# Patient Record
Sex: Male | Born: 1990 | Race: Black or African American | Hispanic: No | Marital: Single | Smoking: Current every day smoker
Health system: Southern US, Community
[De-identification: ages and names within clinical notes are randomized; demographics above are authoritative.]

## PROBLEM LIST (undated history)

## (undated) DIAGNOSIS — F988 Other specified behavioral and emotional disorders with onset usually occurring in childhood and adolescence: Secondary | ICD-10-CM

## (undated) DIAGNOSIS — K859 Acute pancreatitis without necrosis or infection, unspecified: Secondary | ICD-10-CM

## (undated) DIAGNOSIS — Z789 Other specified health status: Secondary | ICD-10-CM

## (undated) HISTORY — PX: NO PAST SURGERIES: SHX2092

---
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
## 2010-06-03 ENCOUNTER — Emergency Department: Payer: Self-pay | Admitting: Emergency Medicine

## 2012-09-06 ENCOUNTER — Emergency Department: Payer: Self-pay | Admitting: Emergency Medicine

## 2013-08-20 ENCOUNTER — Emergency Department: Payer: Self-pay | Admitting: Emergency Medicine

## 2016-02-24 ENCOUNTER — Emergency Department: Payer: Self-pay

## 2016-02-24 ENCOUNTER — Encounter: Payer: Self-pay | Admitting: Emergency Medicine

## 2016-02-24 ENCOUNTER — Emergency Department
Admission: EM | Admit: 2016-02-24 | Discharge: 2016-02-24 | Disposition: A | Discharge status: Home / Self Care | Payer: Self-pay | Attending: Emergency Medicine | Admitting: Emergency Medicine

## 2016-02-24 DIAGNOSIS — F17210 Nicotine dependence, cigarettes, uncomplicated: Secondary | ICD-10-CM | POA: Insufficient documentation

## 2016-02-24 DIAGNOSIS — A084 Viral intestinal infection, unspecified: Secondary | ICD-10-CM | POA: Insufficient documentation

## 2016-02-24 LAB — URINALYSIS COMPLETE WITH MICROSCOPIC (ARMC ONLY)
BACTERIA UA: NONE SEEN
Bilirubin Urine: NEGATIVE
GLUCOSE, UA: NEGATIVE mg/dL
Hgb urine dipstick: NEGATIVE
LEUKOCYTES UA: NEGATIVE
NITRITE: NEGATIVE
PROTEIN: NEGATIVE mg/dL
SPECIFIC GRAVITY, URINE: 1.011 (ref 1.005–1.030)
Squamous Epithelial / LPF: NONE SEEN
pH: 6 (ref 5.0–8.0)

## 2016-02-24 LAB — COMPREHENSIVE METABOLIC PANEL
ALT: 12 U/L — AB (ref 17–63)
AST: 19 U/L (ref 15–41)
Albumin: 4.1 g/dL (ref 3.5–5.0)
Alkaline Phosphatase: 61 U/L (ref 38–126)
Anion gap: 12 (ref 5–15)
BILIRUBIN TOTAL: 1 mg/dL (ref 0.3–1.2)
BUN: 8 mg/dL (ref 6–20)
CALCIUM: 8.9 mg/dL (ref 8.9–10.3)
CHLORIDE: 96 mmol/L — AB (ref 101–111)
CO2: 23 mmol/L (ref 22–32)
CREATININE: 1.2 mg/dL (ref 0.61–1.24)
Glucose, Bld: 95 mg/dL (ref 65–99)
Potassium: 3.3 mmol/L — ABNORMAL LOW (ref 3.5–5.1)
Sodium: 131 mmol/L — ABNORMAL LOW (ref 135–145)
TOTAL PROTEIN: 7.9 g/dL (ref 6.5–8.1)

## 2016-02-24 LAB — URINE DRUG SCREEN, QUALITATIVE (ARMC ONLY)
AMPHETAMINES, UR SCREEN: NOT DETECTED
Barbiturates, Ur Screen: NOT DETECTED
Benzodiazepine, Ur Scrn: NOT DETECTED
CANNABINOID 50 NG, UR ~~LOC~~: POSITIVE — AB
COCAINE METABOLITE, UR ~~LOC~~: POSITIVE — AB
MDMA (ECSTASY) UR SCREEN: NOT DETECTED
Methadone Scn, Ur: NOT DETECTED
Opiate, Ur Screen: NOT DETECTED
Phencyclidine (PCP) Ur S: NOT DETECTED
TRICYCLIC, UR SCREEN: NOT DETECTED

## 2016-02-24 LAB — CBC
HCT: 43.6 % (ref 40.0–52.0)
Hemoglobin: 15.3 g/dL (ref 13.0–18.0)
MCH: 31.6 pg (ref 26.0–34.0)
MCHC: 35.2 g/dL (ref 32.0–36.0)
MCV: 89.7 fL (ref 80.0–100.0)
Platelets: 190 10*3/uL (ref 150–440)
RBC: 4.86 MIL/uL (ref 4.40–5.90)
RDW: 12.8 % (ref 11.5–14.5)
WBC: 15.9 10*3/uL — AB (ref 3.8–10.6)

## 2016-02-24 LAB — LIPASE, BLOOD: Lipase: 24 U/L (ref 11–51)

## 2016-02-24 LAB — POCT RAPID STREP A: STREPTOCOCCUS, GROUP A SCREEN (DIRECT): NEGATIVE

## 2016-02-24 LAB — MONONUCLEOSIS SCREEN: MONO SCREEN: NEGATIVE

## 2016-02-24 MED ORDER — PROMETHAZINE HCL 12.5 MG PO TABS: 12.5000 mg | ORAL_TABLET | Freq: Four times a day (QID) | ORAL | Status: DC | PRN

## 2016-02-24 MED ORDER — PROCHLORPERAZINE EDISYLATE 5 MG/ML IJ SOLN
10.0000 mg | Freq: Once | INTRAMUSCULAR | Status: AC
  Administered 2016-02-24: 10 mg via INTRAVENOUS
  Filled 2016-02-24: qty 2

## 2016-02-24 MED ORDER — SODIUM CHLORIDE 0.9 % IV BOLUS (SEPSIS)
1000.0000 mL | Freq: Once | INTRAVENOUS | Status: AC
  Administered 2016-02-24: 1000 mL via INTRAVENOUS

## 2016-02-24 NOTE — ED Notes (Signed)
Patient transported to X-ray 

## 2016-02-24 NOTE — Discharge Instructions (Signed)
Viral Gastroenteritis °Viral gastroenteritis is also known as stomach flu. This condition affects the stomach and intestinal tract. It can cause sudden diarrhea and vomiting. The illness typically lasts 3 to 8 days. Most people develop an immune response that eventually gets rid of the virus. While this natural response develops, the virus can make you quite ill. °CAUSES  °Many different viruses can cause gastroenteritis, such as rotavirus or noroviruses. You can catch one of these viruses by consuming contaminated food or water. You may also catch a virus by sharing utensils or other personal items with an infected person or by touching a contaminated surface. °SYMPTOMS  °The most common symptoms are diarrhea and vomiting. These problems can cause a severe loss of body fluids (dehydration) and a body salt (electrolyte) imbalance. Other symptoms may include: °· Fever. °· Headache. °· Fatigue. °· Abdominal pain. °DIAGNOSIS  °Your caregiver can usually diagnose viral gastroenteritis based on your symptoms and a physical exam. A stool sample may also be taken to test for the presence of viruses or other infections. °TREATMENT  °This illness typically goes away on its own. Treatments are aimed at rehydration. The most serious cases of viral gastroenteritis involve vomiting so severely that you are not able to keep fluids down. In these cases, fluids must be given through an intravenous line (IV). °HOME CARE INSTRUCTIONS  °· Drink enough fluids to keep your urine clear or pale yellow. Drink small amounts of fluids frequently and increase the amounts as tolerated. °· Ask your caregiver for specific rehydration instructions. °· Avoid: °¨ Foods high in sugar. °¨ Alcohol. °¨ Carbonated drinks. °¨ Tobacco. °¨ Juice. °¨ Caffeine drinks. °¨ Extremely hot or cold fluids. °¨ Fatty, greasy foods. °¨ Too much intake of anything at one time. °¨ Dairy products until 24 to 48 hours after diarrhea stops. °· You may consume probiotics.  Probiotics are active cultures of beneficial bacteria. They may lessen the amount and number of diarrheal stools in adults. Probiotics can be found in yogurt with active cultures and in supplements. °· Wash your hands well to avoid spreading the virus. °· Only take over-the-counter or prescription medicines for pain, discomfort, or fever as directed by your caregiver. Do not give aspirin to children. Antidiarrheal medicines are not recommended. °· Ask your caregiver if you should continue to take your regular prescribed and over-the-counter medicines. °· Keep all follow-up appointments as directed by your caregiver. °SEEK IMMEDIATE MEDICAL CARE IF:  °· You are unable to keep fluids down. °· You do not urinate at least once every 6 to 8 hours. °· You develop shortness of breath. °· You notice blood in your stool or vomit. This may look like coffee grounds. °· You have abdominal pain that increases or is concentrated in one small area (localized). °· You have persistent vomiting or diarrhea. °· You have a fever. °· The patient is a child younger than 3 months, and he or she has a fever. °· The patient is a child older than 3 months, and he or she has a fever and persistent symptoms. °· The patient is a child older than 3 months, and he or she has a fever and symptoms suddenly get worse. °· The patient is a baby, and he or she has no tears when crying. °MAKE SURE YOU:  °· Understand these instructions. °· Will watch your condition. °· Will get help right away if you are not doing well or get worse. °  °This information is not intended to replace   advice given to you by your health care provider. Make sure you discuss any questions you have with your health care provider. °  °Document Released: 10/06/2005 Document Revised: 12/29/2011 Document Reviewed: 07/23/2011 °Elsevier Interactive Patient Education ©2016 [employer] ° °Please return immediately if condition worsens. Please contact her primary physician or the  physician you were given for referral. If you have any specialist physicians involved in her treatment and plan please also contact them. Thank you for using [facility]. ° °

## 2016-02-24 NOTE — ED Notes (Signed)
Pt via EMS for diarrhea x4days, fever of 102.4, pt was given 650 tylenol po. Pt states his head is foggy and has sore throat as well. Pt A&O

## 2016-02-29 NOTE — ED Provider Notes (Signed)
Time Seen: Approximately 1830 I have reviewed the triage notes  Chief Complaint: Diarrhea   History of Present Illness: Johnny Grant is a 25 y.o. male who presents via EMS for a 4 day history of fever and loose watery stool. Patient denies any recent travel or antibiotic therapy. He states he feels "" foggy "" and has trouble concentrating. He said some feelings of lightheadedness without any syncope. He also describes a sore throat mainly with swallowing. He denies any chest pain, shortness of breath, productive cough or wheezing. Patient denies any focal abdominal pain and describes a diffuse occasional crampy discomfort. He denies any melena or hematochezia. Denies any hematemesis or biliary emesis. He denies any illicit drugs.   History reviewed. No pertinent past medical history.  There are no active problems to display for this patient.   History reviewed. No pertinent past surgical history.  History reviewed. No pertinent past surgical history.  Current Outpatient Rx  Name  Route  Sig  Dispense  Refill  . promethazine (PHENERGAN) 12.5 MG tablet   Oral   Take 1 tablet (12.5 mg total) by mouth every 6 (six) hours as needed for nausea or vomiting.   30 tablet   0     Allergies:  Review of patient's allergies indicates no known allergies.  Family History: No family history on file.  Social History: Social History  Substance Use Topics  . Smoking status: Current Every Day Smoker    Types: Cigarettes  . Smokeless tobacco: None  . Alcohol Use: No     Review of Systems:   10 point review of systems was performed and was otherwise negative:  Constitutional: No fever Eyes: No visual disturbances ENT: No sore throat, ear pain Cardiac: No chest pain Respiratory: No shortness of breath, wheezing, or stridor Abdomen: No abdominal pain, no vomiting, Loose watery stool Endocrine: No weight loss, No night sweats Extremities: No peripheral edema, cyanosis Skin: No  rashes, easy bruising Neurologic: No focal weakness, trouble with speech or swollowing Urologic: No dysuria, Hematuria, or urinary frequency   Physical Exam:  ED Triage Vitals  Enc Vitals Group     BP 02/24/16 1725 103/69 mmHg     Pulse Rate 02/24/16 1725 88     Resp 02/24/16 1725 16     Temp 02/24/16 1725 100.3 F (37.9 C)     Temp Source 02/24/16 1725 Oral     SpO2 02/24/16 1725 93 %     Weight --      Height --      Head Cir --      Peak Flow --      Pain Score 02/24/16 1721 8     Pain Loc --      Pain Edu? --      Excl. in GC? --     General: Awake , Alert , and Oriented times 3; GCS 15 Head: Normal cephalic , atraumatic Eyes: Pupils equal , round, reactive to light Nose/Throat: No nasal drainage, patent upper airway without erythema or exudate.  Neck: Supple, Full range of motion, No anterior adenopathy or palpable thyroid masses Lungs: Clear to ascultation without wheezes , rhonchi, or rales Heart: Regular rate, regular rhythm without murmurs , gallops , or rubs Abdomen: Soft, non tender without rebound, guarding , or rigidity; bowel sounds positive and symmetric in all 4 quadrants. No organomegaly .        Extremities: 2 plus symmetric pulses. No edema, clubbing or cyanosis Neurologic: normal  ambulation, Motor symmetric without deficits, sensory intact Skin: warm, dry, no rashes   Labs:   All laboratory work was reviewed including any pertinent negatives or positives listed below:  Labs Reviewed  COMPREHENSIVE METABOLIC PANEL - Abnormal; Notable for the following:    Sodium 131 (*)    Potassium 3.3 (*)    Chloride 96 (*)    ALT 12 (*)    All other components within normal limits  CBC - Abnormal; Notable for the following:    WBC 15.9 (*)    All other components within normal limits  URINALYSIS COMPLETEWITH MICROSCOPIC ([facility] ONLY) - Abnormal; Notable for the following:    Color, Urine YELLOW (*)    APPearance CLEAR (*)    Ketones, ur 1+ (*)    All other  components within normal limits  URINE DRUG SCREEN, QUALITATIVE ([facility] ONLY) - Abnormal; Notable for the following:    Cocaine Metabolite,Ur [place] POSITIVE (*)    Cannabinoid 50 Ng, Ur [place] POSITIVE (*)    All other components within normal limits  LIPASE, BLOOD  MONONUCLEOSIS SCREEN  POCT RAPID STREP A     Radiology:   CLINICAL DATA: Sore throat, fever and diarrhea or 4 days.  EXAM: CHEST 2 VIEW  COMPARISON: 09/06/2012  FINDINGS: The heart size and mediastinal contours are within normal limits. Both lungs are clear. The visualized skeletal structures are unremarkable.  IMPRESSION: Normal chest x-ray.   Electronically Signed By: Rudie Meyer M.D. On: 02/24/2016 18:43    I personally reviewed the radiologic studies   P ED Course:  With patient's consent and his laboratory work and x-ray findings were reviewed with family at the bedside. Patient was given IV fluids and Phenergan here in emergency department felt symptomatically improved. Stool sample was requested but the patient did not have any episodes of diarrhea while here in emergency department. He arrived with some persistent tachycardia which may be cocaine related. His mental status cleared with observation and I felt this may also be illicit drug usage. She was advised drink plenty of fluids and advance his diet as tolerated and return here if he has any focal abdominal pain, melena, or hematochezia. I felt his diarrhea was unlikely to be significant invasive diarrhea or C. difficile toxin.    Assessment:  Viral gastroenteritis Polysubstance abuse  Final Clinical Impression:   Final diagnoses:  Viral gastroenteritis     Plan: Outpatient management Patient was advised to return immediately if condition worsens. Patient was advised to follow up with their primary care physician or other specialized physicians involved in their outpatient care. The patient and/or family member/power of attorney had  laboratory results reviewed at the bedside. All questions and concerns were addressed and appropriate discharge instructions were distributed by the nursing staff.             Jennye Moccasin, MD 02/29/16 [phone]

## 2016-07-24 ENCOUNTER — Encounter: Payer: Self-pay | Admitting: Medical Oncology

## 2016-07-24 ENCOUNTER — Observation Stay
Admission: EM | Admit: 2016-07-24 | Discharge: 2016-07-28 | Disposition: A | Discharge status: Home / Self Care | Payer: Self-pay | Attending: Internal Medicine | Admitting: Internal Medicine

## 2016-07-24 ENCOUNTER — Emergency Department: Payer: Self-pay

## 2016-07-24 DIAGNOSIS — F17210 Nicotine dependence, cigarettes, uncomplicated: Secondary | ICD-10-CM | POA: Insufficient documentation

## 2016-07-24 DIAGNOSIS — N390 Urinary tract infection, site not specified: Secondary | ICD-10-CM

## 2016-07-24 DIAGNOSIS — R1031 Right lower quadrant pain: Principal | ICD-10-CM | POA: Insufficient documentation

## 2016-07-24 DIAGNOSIS — R197 Diarrhea, unspecified: Secondary | ICD-10-CM | POA: Insufficient documentation

## 2016-07-24 DIAGNOSIS — R109 Unspecified abdominal pain: Secondary | ICD-10-CM

## 2016-07-24 DIAGNOSIS — J029 Acute pharyngitis, unspecified: Secondary | ICD-10-CM | POA: Insufficient documentation

## 2016-07-24 DIAGNOSIS — Z23 Encounter for immunization: Secondary | ICD-10-CM | POA: Insufficient documentation

## 2016-07-24 DIAGNOSIS — F988 Other specified behavioral and emotional disorders with onset usually occurring in childhood and adolescence: Secondary | ICD-10-CM | POA: Insufficient documentation

## 2016-07-24 DIAGNOSIS — R112 Nausea with vomiting, unspecified: Secondary | ICD-10-CM | POA: Diagnosis present

## 2016-07-24 DIAGNOSIS — R51 Headache: Secondary | ICD-10-CM | POA: Insufficient documentation

## 2016-07-24 HISTORY — DX: Other specified behavioral and emotional disorders with onset usually occurring in childhood and adolescence: F98.8

## 2016-07-24 HISTORY — DX: Other specified health status: Z78.9

## 2016-07-24 LAB — COMPREHENSIVE METABOLIC PANEL
ALBUMIN: 3.7 g/dL (ref 3.5–5.0)
ALK PHOS: 57 U/L (ref 38–126)
ALT: 11 U/L — AB (ref 17–63)
ANION GAP: 10 (ref 5–15)
AST: 21 U/L (ref 15–41)
BILIRUBIN TOTAL: 0.5 mg/dL (ref 0.3–1.2)
BUN: 8 mg/dL (ref 6–20)
CALCIUM: 8.9 mg/dL (ref 8.9–10.3)
CO2: 25 mmol/L (ref 22–32)
Chloride: 95 mmol/L — ABNORMAL LOW (ref 101–111)
Creatinine, Ser: 1.26 mg/dL — ABNORMAL HIGH (ref 0.61–1.24)
GFR calc Af Amer: >60 mL/min (ref >60)
GFR calc non Af Amer: >60 mL/min (ref >60)
GLUCOSE: 124 mg/dL — AB (ref 65–99)
Potassium: 3.4 mmol/L — ABNORMAL LOW (ref 3.5–5.1)
SODIUM: 130 mmol/L — AB (ref 135–145)
Total Protein: 8 g/dL (ref 6.5–8.1)

## 2016-07-24 LAB — CBC
HEMATOCRIT: 44.8 % (ref 40.0–52.0)
HEMOGLOBIN: 15.7 g/dL (ref 13.0–18.0)
MCH: 32 pg (ref 26.0–34.0)
MCHC: 35.1 g/dL (ref 32.0–36.0)
MCV: 91.2 fL (ref 80.0–100.0)
Platelets: 163 10*3/uL (ref 150–440)
RBC: 4.91 MIL/uL (ref 4.40–5.90)
RDW: 12.6 % (ref 11.5–14.5)
WBC: 14.5 10*3/uL — ABNORMAL HIGH (ref 3.8–10.6)

## 2016-07-24 LAB — POCT RAPID STREP A: STREPTOCOCCUS, GROUP A SCREEN (DIRECT): NEGATIVE

## 2016-07-24 LAB — LIPASE, BLOOD: Lipase: 25 U/L (ref 11–51)

## 2016-07-24 MED ORDER — IOPAMIDOL (ISOVUE-300) INJECTION 61%
100.0000 mL | Freq: Once | INTRAVENOUS | Status: AC | PRN
  Administered 2016-07-24: 100 mL via INTRAVENOUS
  Filled 2016-07-24: qty 100

## 2016-07-24 MED ORDER — ACETAMINOPHEN 325 MG PO TABS
650.0000 mg | ORAL_TABLET | Freq: Four times a day (QID) | ORAL | Status: DC | PRN
  Administered 2016-07-24: 650 mg via ORAL
  Filled 2016-07-24: qty 2

## 2016-07-24 MED ORDER — ONDANSETRON HCL 4 MG PO TABS: 4.0000 mg | ORAL_TABLET | Freq: Four times a day (QID) | ORAL | Status: DC | PRN

## 2016-07-24 MED ORDER — MORPHINE SULFATE (PF) 4 MG/ML IV SOLN
4.0000 mg | Freq: Once | INTRAVENOUS | Status: AC
  Administered 2016-07-24: 4 mg via INTRAVENOUS
  Filled 2016-07-24: qty 1

## 2016-07-24 MED ORDER — SODIUM CHLORIDE 0.9 % IV BOLUS (SEPSIS)
1000.0000 mL | Freq: Once | INTRAVENOUS | Status: AC
  Administered 2016-07-24: 1000 mL via INTRAVENOUS

## 2016-07-24 MED ORDER — ONDANSETRON HCL 4 MG/2ML IJ SOLN
INTRAMUSCULAR | Status: AC
  Administered 2016-07-24: 4 mg via INTRAVENOUS
  Filled 2016-07-24: qty 2

## 2016-07-24 MED ORDER — ACETAMINOPHEN 650 MG RE SUPP: 650.0000 mg | Freq: Four times a day (QID) | RECTAL | Status: DC | PRN

## 2016-07-24 MED ORDER — ONDANSETRON HCL 4 MG/2ML IJ SOLN
4.0000 mg | Freq: Four times a day (QID) | INTRAMUSCULAR | Status: DC | PRN
Start: 2016-07-24 — End: 2016-07-28

## 2016-07-24 MED ORDER — INFLUENZA VAC SPLIT QUAD 0.5 ML IM SUSY
0.5000 mL | PREFILLED_SYRINGE | INTRAMUSCULAR | Status: AC
  Administered 2016-07-28: 0.5 mL via INTRAMUSCULAR
  Filled 2016-07-24: qty 0.5

## 2016-07-24 MED ORDER — PNEUMOCOCCAL VAC POLYVALENT 25 MCG/0.5ML IJ INJ
0.5000 mL | INJECTION | INTRAMUSCULAR | Status: AC
  Administered 2016-07-28: 0.5 mL via INTRAMUSCULAR
  Filled 2016-07-24: qty 0.5

## 2016-07-24 MED ORDER — ACETAMINOPHEN 325 MG PO TABS
650.0000 mg | ORAL_TABLET | Freq: Four times a day (QID) | ORAL | Status: DC | PRN
  Filled 2016-07-24 (×2): qty 2

## 2016-07-24 MED ORDER — MORPHINE SULFATE (PF) 4 MG/ML IV SOLN
4.0000 mg | Freq: Once | INTRAVENOUS | Status: AC
  Administered 2016-07-24: 4 mg via INTRAVENOUS

## 2016-07-24 MED ORDER — ENOXAPARIN SODIUM 40 MG/0.4ML ~~LOC~~ SOLN
40.0000 mg | SUBCUTANEOUS | Status: DC
  Administered 2016-07-26: 40 mg via SUBCUTANEOUS
  Filled 2016-07-24 (×2): qty 0.4

## 2016-07-24 MED ORDER — SODIUM CHLORIDE 0.9 % IV SOLN
INTRAVENOUS | Status: AC
  Administered 2016-07-25: via INTRAVENOUS

## 2016-07-24 MED ORDER — IOPAMIDOL (ISOVUE-300) INJECTION 61%
30.0000 mL | Freq: Once | INTRAVENOUS | Status: AC | PRN
  Administered 2016-07-24: 30 mL via ORAL
  Filled 2016-07-24: qty 30

## 2016-07-24 MED ORDER — MORPHINE SULFATE (PF) 4 MG/ML IV SOLN
INTRAVENOUS | Status: AC
  Administered 2016-07-24: 4 mg via INTRAVENOUS
  Filled 2016-07-24: qty 1

## 2016-07-24 MED ORDER — ONDANSETRON HCL 4 MG/2ML IJ SOLN
4.0000 mg | Freq: Once | INTRAMUSCULAR | Status: AC
  Administered 2016-07-24: 4 mg via INTRAVENOUS

## 2016-07-24 MED ORDER — OXYCODONE HCL 5 MG PO TABS
5.0000 mg | ORAL_TABLET | ORAL | Status: DC | PRN
  Administered 2016-07-25 – 2016-07-28 (×15): 5 mg via ORAL
  Filled 2016-07-24 (×16): qty 1

## 2016-07-24 MED ORDER — ACETAMINOPHEN 325 MG PO TABS
650.0000 mg | ORAL_TABLET | Freq: Four times a day (QID) | ORAL | Status: DC | PRN
  Administered 2016-07-25 – 2016-07-26 (×2): 650 mg via ORAL

## 2016-07-24 NOTE — ED Notes (Signed)
Surgeon at bedside.  

## 2016-07-24 NOTE — ED Triage Notes (Signed)
Pt reports for 3 days he has been having fever, headache, sore throat, nausea vomiting and diarrhea.

## 2016-07-24 NOTE — Consult Note (Signed)
Date of Consultation:  07/24/2016  Requesting Physician:  Gladstone Pih, M.D.  Reason for Consultation:  Abdominal pain with nausea, vomiting, and diarrhea  History of Present Illness: Johnny Grant is a 25 y.o. male who presents with a two-week history of abdominal pain associated with nausea with multiple episodes of emesis as well as multiple episodes of diarrhea. He also reports feeling intermittently feverish and has more recently been having headaches. He also reports feeling weak in the key almost "passed out." He reports that he's been able tolerate some fluids and by mouth intake does not affect the pain that he has. The patient reports that his diarrhea is dark red or maroon in color and reports seeing blood in the toilet bowl as well. Denies any chest pain or shortness of breath, hematuria or dysuria. He also denies any recent travel, antibiotic therapy, or ingestion of raw or expired food. He presented today to emergency room and had workup consisting of laboratory studies and imaging studies. He was previously seen in May with a 4 day history of fever and loose watery stool and was diagnosed with viral gastroenteritis.  Today, he has a white blood cell count of 14.5 and a CT scan which was concerning for a possible early or partial small bowel obstruction. Surgery has been consulted for further evaluation.  Past Medical History: Past Medical History:  Diagnosis Date  . Patient denies medical problems      Past Surgical History: Past Surgical History:  Procedure Laterality Date  . NO PAST SURGERIES      Home Medications: Prior to Admission medications   Medication Sig Start Date End Date Taking? Authorizing Provider  promethazine (PHENERGAN) 12.5 MG tablet Take 1 tablet (12.5 mg total) by mouth every 6 (six) hours as needed for nausea or vomiting. 02/24/16   Jennye Moccasin, MD    Allergies: No Known Allergies  Social History:  reports that he has been smoking  Cigarettes.  He does not have any smokeless tobacco history on file. He reports that he does not drink alcohol or use drugs.   Family History: No family history on file.  Review of Systems: Review of Systems  Constitutional: Positive for fever and malaise/fatigue.  Eyes: Negative for blurred vision.  Respiratory: Negative for cough and shortness of breath.   Cardiovascular: Negative for chest pain and leg swelling.  Gastrointestinal: Positive for abdominal pain, blood in stool, diarrhea, nausea and vomiting. Negative for constipation and heartburn.  Genitourinary: Negative for dysuria and hematuria.  Musculoskeletal: Negative for myalgias.  Skin: Negative for rash.  Neurological: Positive for dizziness and headaches.  Psychiatric/Behavioral: Negative for depression.    Physical Exam BP 128/72 (BP Location: Right Arm)   Pulse 80   Temp 100.2 F (37.9 C) (Oral)   Resp 18   Ht 5\' 6"  (1.676 m)   Wt 72.6 kg (160 lb)   SpO2 98%   BMI 25.82 kg/m  CONSTITUTIONAL: No acute distress HEENT:  Normocephalic, atraumatic, extraocular motion intact. NECK: Trachea is midline, and there is no jugular venous distension.  LYMPH NODES:  Lymph nodes in the neck are not enlarged. RESPIRATORY:  Lungs are clear, and breath sounds are equal bilaterally. Normal respiratory effort without pathologic use of accessory muscles. CARDIOVASCULAR: Heart is regular without murmurs, gallops, or rubs. GI: The abdomen is soft, nondistended, with tenderness to palpation in the center abdomen and right side. There are no peritoneal signs. There were no palpable masses. There was no hepatosplenomegaly. MUSCULOSKELETAL:  Normal muscle strength and tone in all four extremities.  No peripheral edema or cyanosis. SKIN: Skin turgor is normal. There are no pathologic skin lesions.  NEUROLOGIC:  Motor and sensation is grossly normal.  Cranial nerves are grossly intact. PSYCH:  Alert and oriented to person, place and time.  Affect is normal.  Laboratory Analysis: Results for orders placed or performed during the hospital encounter of 07/24/16 (from the past 24 hour(s))  Lipase, blood     Status: None   Collection Time: 07/24/16  2:09 PM  Result Value Ref Range   Lipase 25 11 - 51 U/L  Comprehensive metabolic panel     Status: Abnormal   Collection Time: 07/24/16  2:09 PM  Result Value Ref Range   Sodium 130 (L) 135 - 145 mmol/L   Potassium 3.4 (L) 3.5 - 5.1 mmol/L   Chloride 95 (L) 101 - 111 mmol/L   CO2 25 22 - 32 mmol/L   Glucose, Bld 124 (H) 65 - 99 mg/dL   BUN 8 6 - 20 mg/dL   Creatinine, Ser 8.11 (H) 0.61 - 1.24 mg/dL   Calcium 8.9 8.9 - 91.4 mg/dL   Total Protein 8.0 6.5 - 8.1 g/dL   Albumin 3.7 3.5 - 5.0 g/dL   AST 21 15 - 41 U/L   ALT 11 (L) 17 - 63 U/L   Alkaline Phosphatase 57 38 - 126 U/L   Total Bilirubin 0.5 0.3 - 1.2 mg/dL   GFR calc non Af Amer >60 >60 mL/min   GFR calc Af Amer >60 >60 mL/min   Anion gap 10 5 - 15  CBC     Status: Abnormal   Collection Time: 07/24/16  2:09 PM  Result Value Ref Range   WBC 14.5 (H) 3.8 - 10.6 K/uL   RBC 4.91 4.40 - 5.90 MIL/uL   Hemoglobin 15.7 13.0 - 18.0 g/dL   HCT 78.2 95.6 - 21.3 %   MCV 91.2 80.0 - 100.0 fL   MCH 32.0 26.0 - 34.0 pg   MCHC 35.1 32.0 - 36.0 g/dL   RDW 08.6 57.8 - 46.9 %   Platelets 163 150 - 440 K/uL  POCT rapid strep A [facility])     Status: None   Collection Time: 07/24/16  2:23 PM  Result Value Ref Range   Streptococcus, Group A Screen (Direct) NEGATIVE NEGATIVE    Imaging: Ct Abdomen Pelvis W Contrast  Result Date: 07/24/2016 CLINICAL DATA:  Right upper quadrant and right lower quadrant pain for 3 days. Vomiting, diarrhea. Blood in stool. EXAM: CT ABDOMEN AND PELVIS WITH CONTRAST TECHNIQUE: Multidetector CT imaging of the abdomen and pelvis was performed using the standard protocol following bolus administration of intravenous contrast. CONTRAST:  ISOVUE-300 IOPAMIDOL (ISOVUE-300) INJECTION 61%  COMPARISON:  None. FINDINGS: Lower chest: No pulmonary nodules, pleural effusions, or infiltrates. Heart size is normal. No imaged pericardial effusion or significant coronary artery calcifications. Hepatobiliary: Gallbladder has a normal appearance. Pancreas: Unremarkable. No pancreatic ductal dilatation or surrounding inflammatory changes. Spleen: Normal in size without focal abnormality. Adrenals/Urinary Tract: Adrenal glands are normal in appearance. Kidneys are symmetric in enhancement and size. No hydronephrosis or focal renal mass. Stomach/Bowel: The stomach is normal in appearance. There is mild dilatation of small bowel loops. Contrast does not reach the distal small bowel loops at the time of the exam. Distal small bowel loops are normal in caliber. Terminal ileum has a normal appearance. Colonic loops are normal in appearance are unopacified. Appendix  is not seen. Vascular/Lymphatic: No evidence for aortic aneurysm. No retroperitoneal or mesenteric adenopathy. Reproductive: Urinary bladder, prostate, and seminal vesicles are normal in appearance. No free pelvic fluid. Other: None Musculoskeletal: Visualized osseous structures have a normal appearance. IMPRESSION: 1. Mild dilatation of proximal small bowel loops. Distal small bowel loops are relatively normal in caliber. Findings raise a question of early or partial small bowel obstruction. A definite transition point is not identified. 2. Appendix is not well seen. 3. No abscess or free air. Electronically Signed   By: Norva PavlovElizabeth  Brown M.D.   On: 07/24/2016 17:34    Assessment and Plan: This is a 25 y.o. male who presents with abdominal pain, nausea, vomiting, and diarrhea that have been ongoing for about 2 weeks. Although the CT scan mentions some mildly dilated loops of small bowel I don't believe there is any small bowel obstruction in this case. This may be more of a gastroenteritis versus inflammatory bowel picture. Given his significant  discomfort and dehydration would recommend admitting to the medicine team for IV fluid resuscitation and pain control as well as workup for other potential sources of his symptoms. Would consider obtaining a gastroenterology consult as well. There are no acute surgical needs at this point. The patient understands this plan and all his questions have been answered.   Howie IllJose Luis Shyheem Whitham, MD [facility]

## 2016-07-24 NOTE — H&P (Signed)
[facility] [place] [facility]   PATIENT NAME: Johnny Grant    MR#:  [id number]  DATE OF BIRTH:  01-21-1991  DATE OF ADMISSION:  07/24/2016  PRIMARY CARE PHYSICIAN: No PCP Per Patient   REQUESTING/REFERRING PHYSICIAN: Pershing Proud, MD  CHIEF COMPLAINT:   Chief Complaint  Patient presents with  . Headache  . Nausea  . Emesis  . Diarrhea  . Sore Throat    HISTORY OF PRESENT ILLNESS:  Johnny Grant  is a 25 y.o. male who presents with Abdominal pain, nausea and vomiting with diarrhea. Patient states that he's been having 6-7 stools of diarrhea per day for the last week. Here tonight he was found on CT scan to have a questionable partial small bowel obstruction, though surgical consult did not feel like that was an absolute diagnosis at all. There was some suspicion on the part of the surgeon for possibility of Crohn's. Recommendation was for admission to hospitalist service with symptomatically treatment and consult GI.  PAST MEDICAL HISTORY:   Past Medical History:  Diagnosis Date  . Patient denies medical problems     PAST SURGICAL HISTORY:   Past Surgical History:  Procedure Laterality Date  . NO PAST SURGERIES      SOCIAL HISTORY:   Social History  Substance Use Topics  . Smoking status: Current Every Day Smoker    Types: Cigarettes  . Smokeless tobacco: Not on file  . Alcohol use No    FAMILY HISTORY:   Family History  Problem Relation Age of Onset  . Osteoporosis Maternal Grandmother     DRUG ALLERGIES:  No Known Allergies  MEDICATIONS AT HOME:   Prior to Admission medications   Medication Sig Start Date End Date Taking? Authorizing Provider  promethazine (PHENERGAN) 12.5 MG tablet Take 1 tablet (12.5 mg total) by mouth every 6 (six) hours as needed for nausea or vomiting. 02/24/16   Jennye Moccasin, MD    REVIEW OF SYSTEMS:  Review of Systems  Constitutional: Negative for chills, fever, malaise/fatigue and weight loss.   HENT: Negative for ear pain, hearing loss and tinnitus.   Eyes: Negative for blurred vision, double vision, pain and redness.  Respiratory: Negative for cough, hemoptysis and shortness of breath.   Cardiovascular: Negative for chest pain, palpitations, orthopnea and leg swelling.  Gastrointestinal: Positive for abdominal pain, diarrhea, nausea and vomiting. Negative for constipation.  Genitourinary: Negative for dysuria, frequency and hematuria.  Musculoskeletal: Negative for back pain, joint pain and neck pain.  Skin:       No acne, rash, or lesions  Neurological: Negative for dizziness, tremors, focal weakness and weakness.  Endo/Heme/Allergies: Negative for polydipsia. Does not bruise/bleed easily.  Psychiatric/Behavioral: Negative for depression. The patient is not nervous/anxious and does not have insomnia.      VITAL SIGNS:   Vitals:   07/24/16 1407 07/24/16 1408 07/24/16 1837  BP: 131/76  128/72  Pulse: 91  80  Resp: 18  18  Temp: 100.2 F (37.9 C)    TempSrc: Oral    SpO2: 97%  98%  Weight:  72.6 kg (160 lb)   Height:  5\' 6"  (1.676 m)    Wt Readings from Last 3 Encounters:  07/24/16 72.6 kg (160 lb)    PHYSICAL EXAMINATION:  Physical Exam  Vitals reviewed. Constitutional: He is oriented to person, place, and time. He appears well-developed and well-nourished. No distress.  HENT:  Head: Normocephalic and atraumatic.  Mouth/Throat: Oropharynx is clear and moist.  Eyes: Conjunctivae and EOM are normal. Pupils are equal, round, and reactive to light. No scleral icterus.  Neck: Normal range of motion. Neck supple. No JVD present. No thyromegaly present.  Cardiovascular: Normal rate, regular rhythm and intact distal pulses.  Exam reveals no gallop and no friction rub.   No murmur heard. Respiratory: Effort normal and breath sounds normal. No respiratory distress. He has no wheezes. He has no rales.  GI: Soft. Bowel sounds are normal. He exhibits no distension. There is  tenderness (diffuse, mild).  Musculoskeletal: Normal range of motion. He exhibits no edema.  No arthritis, no gout  Lymphadenopathy:    He has no cervical adenopathy.  Neurological: He is alert and oriented to person, place, and time. No cranial nerve deficit.  No dysarthria, no aphasia  Skin: Skin is warm and dry. No rash noted. No erythema.  Psychiatric: He has a normal mood and affect. His behavior is normal. Judgment and thought content normal.    LABORATORY PANEL:   CBC  Recent Labs Lab 07/24/16 1409  WBC 14.5*  HGB 15.7  HCT 44.8  PLT 163   ------------------------------------------------------------------------------------------------------------------  Chemistries   Recent Labs Lab 07/24/16 1409  NA 130*  K 3.4*  CL 95*  CO2 25  GLUCOSE 124*  BUN 8  CREATININE 1.26*  CALCIUM 8.9  AST 21  ALT 11*  ALKPHOS 57  BILITOT 0.5   ------------------------------------------------------------------------------------------------------------------  Cardiac Enzymes No results for input(s): TROPONINI in the last 168 hours. ------------------------------------------------------------------------------------------------------------------  RADIOLOGY:  Ct Abdomen Pelvis W Contrast  Result Date: 07/24/2016 CLINICAL DATA:  Right upper quadrant and right lower quadrant pain for 3 days. Vomiting, diarrhea. Blood in stool. EXAM: CT ABDOMEN AND PELVIS WITH CONTRAST TECHNIQUE: Multidetector CT imaging of the abdomen and pelvis was performed using the standard protocol following bolus administration of intravenous contrast. CONTRAST:  100mL ISOVUE-300 IOPAMIDOL (ISOVUE-300) INJECTION 61% COMPARISON:  None. FINDINGS: Lower chest: No pulmonary nodules, pleural effusions, or infiltrates. Heart size is normal. No imaged pericardial effusion or significant coronary artery calcifications. Hepatobiliary: Gallbladder has a normal appearance. Pancreas: Unremarkable. No pancreatic ductal  dilatation or surrounding inflammatory changes. Spleen: Normal in size without focal abnormality. Adrenals/Urinary Tract: Adrenal glands are normal in appearance. Kidneys are symmetric in enhancement and size. No hydronephrosis or focal renal mass. Stomach/Bowel: The stomach is normal in appearance. There is mild dilatation of small bowel loops. Contrast does not reach the distal small bowel loops at the time of the exam. Distal small bowel loops are normal in caliber. Terminal ileum has a normal appearance. Colonic loops are normal in appearance are unopacified. Appendix is not seen. Vascular/Lymphatic: No evidence for aortic aneurysm. No retroperitoneal or mesenteric adenopathy. Reproductive: Urinary bladder, prostate, and seminal vesicles are normal in appearance. No free pelvic fluid. Other: None Musculoskeletal: Visualized osseous structures have a normal appearance. IMPRESSION: 1. Mild dilatation of proximal small bowel loops. Distal small bowel loops are relatively normal in caliber. Findings raise a question of early or partial small bowel obstruction. A definite transition point is not identified. 2. Appendix is not well seen. 3. No abscess or free air. Electronically Signed   By: Norva PavlovElizabeth  Brown M.D.   On: 07/24/2016 17:34    EKG:   Orders placed or performed in visit on 07/09/09  . EKG 12-Lead    IMPRESSION AND PLAN:  Principal Problem:   Intractable abdominal pain - unclear etiology at this time. CT scan very mildly suggestive of possible partial small bowel obstruction. However, clinical  symptoms are not is consistent with this diagnosis. Surgical consult felt like Crohn's was a possibility in the differential. GI consult ordered, when necessary pain control Active Problems:   Nausea vomiting and diarrhea - IV fluids tonight, when necessary antiemetics  All the records are reviewed and case discussed with ED provider. Management plans discussed with the patient and/or family.  DVT  PROPHYLAXIS: SubQ lovenox  GI PROPHYLAXIS: None  ADMISSION STATUS: Observation  CODE STATUS: Full Code Status History    This patient does not have a recorded code status. Please follow your organizational policy for patients in this situation.      TOTAL TIME TAKING CARE OF THIS PATIENT: 40 minutes.    Alenah Sarria FIELDING 07/24/2016, 9:34 PM  [facility]  Office  [phone]  CC: Primary care physician; No PCP Per Patient

## 2016-07-24 NOTE — ED Notes (Signed)
Finished PO contrast   States no change in pain

## 2016-07-24 NOTE — ED Notes (Addendum)
See triage note  Has had fever  Sore throat with some n/v/d and generalized abd disocmfort which started 3 days ago  Low grade fever on arrival  Last time vomited was last pm  Has had diarrhea several times today

## 2016-07-24 NOTE — ED Provider Notes (Signed)
[facility] Emergency Department Provider Note   ____________________________________________   First MD Initiated Contact with Patient 07/24/16 1508     (approximate)  I have reviewed the triage vital signs and the nursing notes.   HISTORY  Chief Complaint Headache; Nausea; Emesis; Diarrhea; and Sore Throat   HPI Johnny Grant is a 25 y.o. male without any chronic medical problems who is presenting to the emergency department today with diffuse abdominal pain as well as nausea vomiting and diarrhea. He says that he has had 6-7 ounces of diarrhea per day. Denies any blood in the stool. Also said he has had nausea and vomiting. Denies any cough or runny nose. Is complaining of a mild sore throat as well. Also says has a headache which is mild and bitemporal as well as some lightheadedness which is intermittent. Denies any known sick contacts. Has been taking Tylenol without any relief of his symptoms.     History reviewed. No pertinent past medical history.  There are no active problems to display for this patient.   History reviewed. No pertinent surgical history.  Prior to Admission medications   Medication Sig Start Date End Date Taking? Authorizing Provider  promethazine (PHENERGAN) 12.5 MG tablet Take 1 tablet (12.5 mg total) by mouth every 6 (six) hours as needed for nausea or vomiting. 02/24/16   Jennye Moccasin, MD    Allergies Review of patient's allergies indicates no known allergies.  No family history on file.  Social History Social History  Substance Use Topics  . Smoking status: Current Every Day Smoker    Types: Cigarettes  . Smokeless tobacco: Not on file  . Alcohol use No    Review of Systems Constitutional: as above Eyes: No visual changes. ENT: No sore throat. Cardiovascular: Denies chest pain. Respiratory: Denies shortness of breath. Gastrointestinal:  No constipation. Genitourinary: Negative for  dysuria. Musculoskeletal: Negative for back pain. Skin: Negative for rash. Neurological: Negative for headaches, focal weakness or numbness.  10-point ROS otherwise negative.  ____________________________________________   PHYSICAL EXAM:  VITAL SIGNS: ED Triage Vitals  Enc Vitals Group     BP 07/24/16 1407 131/76     Pulse Rate 07/24/16 1407 91     Resp 07/24/16 1407 18     Temp 07/24/16 1407 100.2 F (37.9 C)     Temp Source 07/24/16 1407 Oral     SpO2 07/24/16 1407 97 %     Weight 07/24/16 1408 160 lb (72.6 kg)     Height 07/24/16 1408 5\' 6"  (1.676 m)     Head Circumference --      Peak Flow --      Pain Score 07/24/16 1408 9     Pain Loc --      Pain Edu? --      Excl. in GC? --     Constitutional: Alert and oriented. Well appearing and in no acute distress. Eyes: Conjunctivae are normal. PERRL. EOMI. Head: Atraumatic. Nose: No congestion/rhinnorhea. Mouth/Throat: Mucous membranes are moist.  Pharyngeal erythema with left sided tonsillar pus. There is also tender lymphadenopathy to the bilateral anterior cervical chains. Neck: No stridor.   Cardiovascular: Normal rate, regular rhythm. Grossly normal heart sounds.  Good peripheral circulation. Respiratory: Normal respiratory effort.  No retractions. Lungs CTAB. Gastrointestinal: Soft With right upper quadrant as well as right flank tenderness to palpation with a negative Murphy sign. No distention. No CVA tenderness. Musculoskeletal: No lower extremity tenderness nor edema.  No joint effusions.  Neurologic:  Normal speech and language. No gross focal neurologic deficits are appreciated. No gait instability. Skin:  Skin is warm, dry and intact. No rash noted. Psychiatric: Mood and affect are normal. Speech and behavior are normal.  ____________________________________________   LABS (all labs ordered are listed, but only abnormal results are displayed)  Labs Reviewed  COMPREHENSIVE METABOLIC PANEL - Abnormal;  Notable for the following:       Result Value   Sodium 130 (*)    Potassium 3.4 (*)    Chloride 95 (*)    Glucose, Bld 124 (*)    Creatinine, Ser 1.26 (*)    ALT 11 (*)    All other components within normal limits  CBC - Abnormal; Notable for the following:    WBC 14.5 (*)    All other components within normal limits  LIPASE, BLOOD  URINALYSIS COMPLETEWITH MICROSCOPIC ([facility] ONLY)  POCT RAPID STREP A   ____________________________________________  EKG   ____________________________________________  RADIOLOGY  CT Abdomen Pelvis W Contrast (Accession [id number]) (Order [id number])  Imaging  Date: 07/24/2016 Department: [facility] EMERGENCY DEPARTMENT Released By/Authorizing: Myrna Blazer, MD (auto-released)  Exam Information   Status Exam Begun  Exam Ended   Final [99] 07/24/2016 5:07 PM 07/24/2016 5:15 PM  PACS Images   Show images for CT Abdomen Pelvis W Contrast  Study Result   CLINICAL DATA:  Right upper quadrant and right lower quadrant pain for 3 days. Vomiting, diarrhea. Blood in stool.  EXAM: CT ABDOMEN AND PELVIS WITH CONTRAST  TECHNIQUE: Multidetector CT imaging of the abdomen and pelvis was performed using the standard protocol following bolus administration of intravenous contrast.  CONTRAST:  ISOVUE-300 IOPAMIDOL (ISOVUE-300) INJECTION 61%  COMPARISON:  None.  FINDINGS: Lower chest: No pulmonary nodules, pleural effusions, or infiltrates. Heart size is normal. No imaged pericardial effusion or significant coronary artery calcifications.  Hepatobiliary: Gallbladder has a normal appearance.  Pancreas: Unremarkable. No pancreatic ductal dilatation or surrounding inflammatory changes.  Spleen: Normal in size without focal abnormality.  Adrenals/Urinary Tract: Adrenal glands are normal in appearance. Kidneys are symmetric in enhancement and size. No hydronephrosis or focal renal  mass.  Stomach/Bowel: The stomach is normal in appearance. There is mild dilatation of small bowel loops. Contrast does not reach the distal small bowel loops at the time of the exam. Distal small bowel loops are normal in caliber. Terminal ileum has a normal appearance. Colonic loops are normal in appearance are unopacified. Appendix is not seen.  Vascular/Lymphatic: No evidence for aortic aneurysm. No retroperitoneal or mesenteric adenopathy.  Reproductive: Urinary bladder, prostate, and seminal vesicles are normal in appearance. No free pelvic fluid.  Other: None  Musculoskeletal: Visualized osseous structures have a normal appearance.  IMPRESSION: 1. Mild dilatation of proximal small bowel loops. Distal small bowel loops are relatively normal in caliber. Findings raise a question of early or partial small bowel obstruction. A definite transition point is not identified. 2. Appendix is not well seen. 3. No abscess or free air.   Electronically Signed   By: Norva Pavlov M.D.   On: 07/24/2016 17:34     ____________________________________________   PROCEDURES  Procedure(s) performed:   Procedures  Critical Care performed:   ____________________________________________   INITIAL IMPRESSION / ASSESSMENT AND PLAN / ED COURSE  Pertinent labs & imaging results that were available during my care of the patient were reviewed by me and considered in my medical decision making (see chart for details).  ----------------------------------------- 550 PM  on 07/24/2016 ----------------------------------------- Discussed the imaging results with Dr. Ludwig LeanLaughlin who reviewed the images. She says that the images most likely a gastroenteritis. However, when I reassessed the patient he is still having moderate to severe pain. He is requesting another dose of pain medications and he is still tender to palpation especially to the right side of the abdomen. He will be  seen by surgery as a consult.   Clinical Course   ----------------------------------------- 9:03 PM on 07/24/2016 -----------------------------------------  Patient evaluated by Dr. Aleen CampiPiscoya of the surgical service who recommends admission to the hospital to the medicine service with a GI consult. Dr. Aleen CampiPiscoya, by Dr. Ludwig LeanLaughlin also is skeptical of the patient having a small bowel obstruction. Patient reporting blood streaks in his stool. Also try multiple doses of IV pain medication and still tender to the right side of the abdomen. Patient aware of the plan for admission to the hospital. Signed out to Dr. Anne HahnWillis.  ____________________________________________   FINAL CLINICAL IMPRESSION(S) / ED DIAGNOSES  Intractable abdominal pain. Nausea vomiting diarrhea.    NEW MEDICATIONS STARTED DURING THIS VISIT:  New Prescriptions   No medications on file     Note:  This document was prepared using Dragon voice recognition software and may include unintentional dictation errors.    Myrna Blazeravid Matthew Mihail Prettyman, MD 07/24/16 2104

## 2016-07-25 ENCOUNTER — Encounter: Payer: Self-pay | Admitting: *Deleted

## 2016-07-25 ENCOUNTER — Observation Stay: Payer: Self-pay

## 2016-07-25 DIAGNOSIS — R109 Unspecified abdominal pain: Secondary | ICD-10-CM

## 2016-07-25 LAB — CBC WITH DIFFERENTIAL/PLATELET
Band Neutrophils: 2 %
Basophils Absolute: 0 10*3/uL (ref 0–0.1)
Basophils Relative: 0 %
Blasts: 0 %
Eosinophils Absolute: 0 10*3/uL (ref 0–0.7)
Eosinophils Relative: 0 %
HCT: 42.2 % (ref 40.0–52.0)
Hemoglobin: 14.6 g/dL (ref 13.0–18.0)
Lymphocytes Relative: 28 %
Lymphs Abs: 3.2 10*3/uL (ref 1.0–3.6)
MCH: 32.1 pg (ref 26.0–34.0)
MCHC: 34.6 g/dL (ref 32.0–36.0)
MCV: 92.6 fL (ref 80.0–100.0)
Metamyelocytes Relative: 0 %
Monocytes Absolute: 2.1 10*3/uL — ABNORMAL HIGH (ref 0.2–1.0)
Monocytes Relative: 18 %
Myelocytes: 0 %
Neutro Abs: 6.1 10*3/uL (ref 1.4–6.5)
Neutrophils Relative %: 52 %
Other: 0 %
Platelets: 155 10*3/uL (ref 150–440)
Promyelocytes Absolute: 0 %
RBC: 4.55 MIL/uL (ref 4.40–5.90)
RDW: 12.5 % (ref 11.5–14.5)
WBC: 11.4 10*3/uL — ABNORMAL HIGH (ref 3.8–10.6)
nRBC: 0 /100 WBC

## 2016-07-25 LAB — C DIFFICILE QUICK SCREEN W PCR REFLEX
C DIFFICLE (CDIFF) ANTIGEN: NEGATIVE
C Diff interpretation: NOT DETECTED
C Diff toxin: NEGATIVE

## 2016-07-25 LAB — URINE DRUG SCREEN, QUALITATIVE (ARMC ONLY)
Amphetamines, Ur Screen: NOT DETECTED
Barbiturates, Ur Screen: NOT DETECTED
Benzodiazepine, Ur Scrn: NOT DETECTED
Cannabinoid 50 Ng, Ur ~~LOC~~: POSITIVE — AB
Cocaine Metabolite,Ur ~~LOC~~: POSITIVE — AB
MDMA (Ecstasy)Ur Screen: NOT DETECTED
Methadone Scn, Ur: NOT DETECTED
Opiate, Ur Screen: POSITIVE — AB
Phencyclidine (PCP) Ur S: NOT DETECTED
Tricyclic, Ur Screen: NOT DETECTED

## 2016-07-25 LAB — SEDIMENTATION RATE: Sed Rate: 49 mm/hr — ABNORMAL HIGH (ref 0–15)

## 2016-07-25 LAB — URINALYSIS COMPLETE WITH MICROSCOPIC (ARMC ONLY)
BILIRUBIN URINE: NEGATIVE
Bacteria, UA: NONE SEEN
Glucose, UA: NEGATIVE mg/dL
Hgb urine dipstick: NEGATIVE
LEUKOCYTES UA: NEGATIVE
NITRITE: NEGATIVE
PH: 6 (ref 5.0–8.0)
Protein, ur: NEGATIVE mg/dL
SPECIFIC GRAVITY, URINE: 1.016 (ref 1.005–1.030)

## 2016-07-25 LAB — GASTROINTESTINAL PANEL BY PCR, STOOL (REPLACES STOOL CULTURE)
ASTROVIRUS: NOT DETECTED
Adenovirus F40/41: NOT DETECTED
CYCLOSPORA CAYETANENSIS: NOT DETECTED
Campylobacter species: NOT DETECTED
Cryptosporidium: NOT DETECTED
E. coli O157: NOT DETECTED
ENTAMOEBA HISTOLYTICA: NOT DETECTED
ENTEROAGGREGATIVE E COLI (EAEC): NOT DETECTED
ENTEROTOXIGENIC E COLI (ETEC): NOT DETECTED
Enteropathogenic E coli (EPEC): NOT DETECTED
GIARDIA LAMBLIA: NOT DETECTED
NOROVIRUS GI/GII: NOT DETECTED
Plesimonas shigelloides: NOT DETECTED
Rotavirus A: NOT DETECTED
Salmonella species: NOT DETECTED
Sapovirus (I, II, IV, and V): NOT DETECTED
Shiga like toxin producing E coli (STEC): NOT DETECTED
Shigella/Enteroinvasive E coli (EIEC): NOT DETECTED
VIBRIO CHOLERAE: NOT DETECTED
VIBRIO SPECIES: NOT DETECTED
Yersinia enterocolitica: NOT DETECTED

## 2016-07-25 LAB — CBC
HEMATOCRIT: 40.5 % (ref 40.0–52.0)
Hemoglobin: 14.1 g/dL (ref 13.0–18.0)
MCH: 31.9 pg (ref 26.0–34.0)
MCHC: 34.7 g/dL (ref 32.0–36.0)
MCV: 91.9 fL (ref 80.0–100.0)
PLATELETS: 146 10*3/uL — AB (ref 150–440)
RBC: 4.4 MIL/uL (ref 4.40–5.90)
RDW: 12.6 % (ref 11.5–14.5)
WBC: 13.4 10*3/uL — ABNORMAL HIGH (ref 3.8–10.6)

## 2016-07-25 LAB — BASIC METABOLIC PANEL
Anion gap: 12 (ref 5–15)
BUN: 8 mg/dL (ref 6–20)
CHLORIDE: 98 mmol/L — AB (ref 101–111)
CO2: 22 mmol/L (ref 22–32)
CREATININE: 1.05 mg/dL (ref 0.61–1.24)
Calcium: 8.2 mg/dL — ABNORMAL LOW (ref 8.9–10.3)
GFR calc Af Amer: >60 mL/min (ref >60)
GFR calc non Af Amer: >60 mL/min (ref >60)
GLUCOSE: 92 mg/dL (ref 65–99)
Potassium: 3.2 mmol/L — ABNORMAL LOW (ref 3.5–5.1)
SODIUM: 132 mmol/L — AB (ref 135–145)

## 2016-07-25 LAB — C-REACTIVE PROTEIN: CRP: 16.4 mg/dL — ABNORMAL HIGH (ref <1.0)

## 2016-07-25 MED ORDER — DIPHENHYDRAMINE HCL 25 MG PO CAPS: 25.0000 mg | ORAL_CAPSULE | Freq: Every evening | ORAL | Status: DC | PRN

## 2016-07-25 MED ORDER — POTASSIUM CHLORIDE 20 MEQ PO PACK
40.0000 meq | PACK | Freq: Two times a day (BID) | ORAL | Status: DC
  Administered 2016-07-25: 40 meq via ORAL
  Filled 2016-07-25 (×2): qty 2

## 2016-07-25 MED ORDER — FAMOTIDINE IN NACL 20-0.9 MG/50ML-% IV SOLN
20.0000 mg | Freq: Two times a day (BID) | INTRAVENOUS | Status: DC
  Administered 2016-07-25 – 2016-07-27 (×6): 20 mg via INTRAVENOUS
  Filled 2016-07-25 (×9): qty 50

## 2016-07-25 MED ORDER — DEXTROSE 5 % IV SOLN
1.0000 g | INTRAVENOUS | Status: DC
  Administered 2016-07-25 – 2016-07-27 (×3): 1 g via INTRAVENOUS
  Filled 2016-07-25 (×4): qty 10

## 2016-07-25 MED ORDER — POTASSIUM CHLORIDE CRYS ER 20 MEQ PO TBCR
40.0000 meq | EXTENDED_RELEASE_TABLET | Freq: Two times a day (BID) | ORAL | Status: DC
  Administered 2016-07-25 – 2016-07-28 (×6): 40 meq via ORAL
  Filled 2016-07-25 (×6): qty 2

## 2016-07-25 MED ORDER — MORPHINE SULFATE (PF) 2 MG/ML IV SOLN
2.0000 mg | Freq: Once | INTRAVENOUS | Status: AC
  Administered 2016-07-25: 2 mg via INTRAVENOUS
  Filled 2016-07-25: qty 1

## 2016-07-25 MED ORDER — SODIUM CHLORIDE 0.9 % IV SOLN
INTRAVENOUS | Status: DC
  Administered 2016-07-25 – 2016-07-28 (×6): via INTRAVENOUS
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
## 2016-07-25 NOTE — Consult Note (Signed)
Patient with acute onset of vomiting, diarrhea, headache, doing better now and no diarrhea while not eating.  He requests more food, will start clear liquids and advance as tolerated.   WBC has fallen from 14 to 11.  PE shows some tenderness in RUQ, no palpable masses.  Will follow with you, see note by Fransico SettersKim Mills.  Patient urine positive for cocaine and cannabis.

## 2016-07-25 NOTE — Progress Notes (Signed)
[facility] [place] [facility]   PATIENT NAME: Johnny Grant    MR#:  [id number]  DATE OF BIRTH:  26-Feb-1991  SUBJECTIVE:  CHIEF COMPLAINT:   Chief Complaint  Patient presents with  . Headache  . Nausea  . Emesis  . Diarrhea  . Sore Throat     Came with nausea, vomit, Abdominal pain , diarrhea for last 1 week with fever.   CT abd without acute findings.   UA now reported positive. Stool studies still need to be checked.  REVIEW OF SYSTEMS:  CONSTITUTIONAL: positive for  fever, fatigue or weakness.  EYES: No blurred or double vision.  EARS, NOSE, AND THROAT: No tinnitus or ear pain.  RESPIRATORY: No cough, shortness of breath, wheezing or hemoptysis.  CARDIOVASCULAR: No chest pain, orthopnea, edema.  GASTROINTESTINAL: positive for nausea, vomiting, diarrhea or abdominal pain.  GENITOURINARY: No dysuria, hematuria.  ENDOCRINE: No polyuria, nocturia,  HEMATOLOGY: No anemia, easy bruising or bleeding SKIN: No rash or lesion. MUSCULOSKELETAL: No joint pain or arthritis.   NEUROLOGIC: No tingling, numbness, weakness.  PSYCHIATRY: No anxiety or depression.   ROS  DRUG ALLERGIES:  No Known Allergies  VITALS:  Blood pressure 130/67, pulse 77, temperature 99.1 F (37.3 C), temperature source Oral, resp. rate 19, height 5\' 6"  (1.676 m), weight 67 kg (147 lb 12.8 oz), SpO2 98 %.  PHYSICAL EXAMINATION:  GENERAL:  25 y.o.-year-old patient lying in the bed with no acute distress.  EYES: Pupils equal, round, reactive to light and accommodation. No scleral icterus. Extraocular muscles intact.  HEENT: Head atraumatic, normocephalic. Oropharynx and nasopharynx clear.  NECK:  Supple, no jugular venous distention. No thyroid enlargement, no tenderness.  LUNGS: Normal breath sounds bilaterally, no wheezing, rales,rhonchi or crepitation. No use of accessory muscles of respiration.  CARDIOVASCULAR: S1, S2 normal. No murmurs, rubs, or gallops.  ABDOMEN: Soft, tender,  nondistended. Bowel sounds present. No organomegaly or mass.  EXTREMITIES: No pedal edema, cyanosis, or clubbing.  NEUROLOGIC: Cranial nerves II through XII are intact. Muscle strength 5/5 in all extremities. Sensation intact. Gait not checked.  PSYCHIATRIC: The patient is alert and oriented x 3.  SKIN: No obvious rash, lesion, or ulcer. Multiple tattoo marks.  Physical Exam LABORATORY PANEL:   CBC  Recent Labs Lab 07/25/16 0532  WBC 13.4*  HGB 14.1  HCT 40.5  PLT 146*   ------------------------------------------------------------------------------------------------------------------  Chemistries   Recent Labs Lab 07/24/16 1409 07/25/16 0532  NA 130* 132*  K 3.4* 3.2*  CL 95* 98*  CO2 25 22  GLUCOSE 124* 92  BUN 8 8  CREATININE 1.26* 1.05  CALCIUM 8.9 8.2*  AST 21  --   ALT 11*  --   ALKPHOS 57  --   BILITOT 0.5  --    ------------------------------------------------------------------------------------------------------------------  Cardiac Enzymes No results for input(s): TROPONINI in the last 168 hours. ------------------------------------------------------------------------------------------------------------------  RADIOLOGY:  Ct Abdomen Pelvis W Contrast  Result Date: 07/24/2016 CLINICAL DATA:  Right upper quadrant and right lower quadrant pain for 3 days. Vomiting, diarrhea. Blood in stool. EXAM: CT ABDOMEN AND PELVIS WITH CONTRAST TECHNIQUE: Multidetector CT imaging of the abdomen and pelvis was performed using the standard protocol following bolus administration of intravenous contrast. CONTRAST:  ISOVUE-300 IOPAMIDOL (ISOVUE-300) INJECTION 61% COMPARISON:  None. FINDINGS: Lower chest: No pulmonary nodules, pleural effusions, or infiltrates. Heart size is normal. No imaged pericardial effusion or significant coronary artery calcifications. Hepatobiliary: Gallbladder has a normal appearance. Pancreas: Unremarkable. No pancreatic ductal dilatation or  surrounding inflammatory changes. Spleen: Normal in size without focal abnormality. Adrenals/Urinary Tract: Adrenal glands are normal in appearance. Kidneys are symmetric in enhancement and size. No hydronephrosis or focal renal mass. Stomach/Bowel: The stomach is normal in appearance. There is mild dilatation of small bowel loops. Contrast does not reach the distal small bowel loops at the time of the exam. Distal small bowel loops are normal in caliber. Terminal ileum has a normal appearance. Colonic loops are normal in appearance are unopacified. Appendix is not seen. Vascular/Lymphatic: No evidence for aortic aneurysm. No retroperitoneal or mesenteric adenopathy. Reproductive: Urinary bladder, prostate, and seminal vesicles are normal in appearance. No free pelvic fluid. Other: None Musculoskeletal: Visualized osseous structures have a normal appearance. IMPRESSION: 1. Mild dilatation of proximal small bowel loops. Distal small bowel loops are relatively normal in caliber. Findings raise a question of early or partial small bowel obstruction. A definite transition point is not identified. 2. Appendix is not well seen. 3. No abscess or free air. Electronically Signed   By: Norva PavlovElizabeth  Brown M.D.   On: 07/24/2016 17:34    ASSESSMENT AND PLAN:   Principal Problem:   Intractable abdominal pain Active Problems:   Nausea vomiting and diarrhea  *  Intractable abdominal pain, diarrhea - unclear etiology at this time. CT scan very mildly suggestive of possible partial small bowel obstruction. However, clinical symptoms are not is consistent with this diagnosis. Surgical consult felt like Crohn's was a possibility in the differential. GI consult ordered, when necessary pain control   Will give IV fluids and supportive care.   Gi panel and c diff ordered on stool.   *  Nausea vomiting and diarrhea - IV fluids tonight, when necessary antiemetics  * Fever    Elevated WBCs    UA positive.    Get Ur cx, renal  sono.    No signs of stone on CT abd.   Rocephin IV.  * Ur drug screen positive.   Pt claims to have taken coccaine only once in last few days, and he does not take it regularly.    All the records are reviewed and case discussed with Care Management/Social Workerr. Management plans discussed with the patient, family and they are in agreement.  CODE STATUS: full.  TOTAL TIME TAKING CARE OF THIS PATIENT: 35 minutes.   POSSIBLE D/C IN 1-2 DAYS, DEPENDING ON CLINICAL CONDITION.   Altamese DillingVACHHANI, Elianie Hubers M.D on 07/25/2016   Between 7am to 6pm - Pager - [phone]  After 6pm go to [email or web address] - Social research officer, governmentpassword EPAS [facility]  [facility]  Office  [phone]  CC: Primary care physician; No PCP Per Patient  Note: This dictation was prepared with Dragon dictation along with smaller phrase technology. Any transcriptional errors that result from this process are unintentional.

## 2016-07-25 NOTE — Progress Notes (Signed)
CC: Right-sided abdominal pain Subjective: History is reviewed with the patient and in the chart. He describes 2 weeks of abdominal pain with diarrhea he's had some nausea but no emesis today. His fevers or chills but he has a documented low-grade temp. He's never had an episode like this before the 2 weeks ago when this started. He points the right lower quadrant as side of his pain and tenderness  A recent CT scan failed to identify the appendix. But there was certainly no sign of abscess or ruptured appendix. These films of been personally reviewed.  Objective: Vital signs in last 24 hours: Temp:  [99.1 F (37.3 C)-102.7 F (39.3 C)] 99.1 F (37.3 C) (10/06 1621) Pulse Rate:  [77-87] 83 (10/06 1621) Resp:  [16-19] 17 (10/06 1621) BP: (108-130)/(50-68) 114/68 (10/06 1621) SpO2:  [97 %-100 %] 98 % (10/06 1621) Weight:  [147 lb 12.8 oz (67 kg)] 147 lb 12.8 oz (67 kg) (10/05 2327) Last BM Date: 07/24/16  Intake/Output from previous day: 10/05 0701 - 10/06 0700 In: 467 [I.V.:467] Out: -  Intake/Output this shift: Total I/O In: 735 [P.O.:125; I.V.:610] Out: -   Physical exam:  Patient awake alert oriented refuses to get off of the telephone. Vital signs are reviewed. Abdomen is soft nondistended nontympanitic minimally tender diffusely but most in the right side right lower quadrant without peritoneal signs. Multiple tattoos No icterus no jaundice Nontender calves  Lab Results: CBC   Recent Labs  07/25/16 0532 07/25/16 1553  WBC 13.4* 11.4*  HGB 14.1 14.6  HCT 40.5 42.2  PLT 146* 155   BMET  Recent Labs  07/24/16 1409 07/25/16 0532  NA 130* 132*  K 3.4* 3.2*  CL 95* 98*  CO2 25 22  GLUCOSE 124* 92  BUN 8 8  CREATININE 1.26* 1.05  CALCIUM 8.9 8.2*   PT/INR No results for input(s): LABPROT, INR in the last 72 hours. ABG No results for input(s): PHART, HCO3 in the last 72 hours.  Invalid input(s): PCO2, PO2  Studies/Results: Ct Abdomen Pelvis W  Contrast  Result Date: 07/24/2016 CLINICAL DATA:  Right upper quadrant and right lower quadrant pain for 3 days. Vomiting, diarrhea. Blood in stool. EXAM: CT ABDOMEN AND PELVIS WITH CONTRAST TECHNIQUE: Multidetector CT imaging of the abdomen and pelvis was performed using the standard protocol following bolus administration of intravenous contrast. CONTRAST:  100mL ISOVUE-300 IOPAMIDOL (ISOVUE-300) INJECTION 61% COMPARISON:  None. FINDINGS: Lower chest: No pulmonary nodules, pleural effusions, or infiltrates. Heart size is normal. No imaged pericardial effusion or significant coronary artery calcifications. Hepatobiliary: Gallbladder has a normal appearance. Pancreas: Unremarkable. No pancreatic ductal dilatation or surrounding inflammatory changes. Spleen: Normal in size without focal abnormality. Adrenals/Urinary Tract: Adrenal glands are normal in appearance. Kidneys are symmetric in enhancement and size. No hydronephrosis or focal renal mass. Stomach/Bowel: The stomach is normal in appearance. There is mild dilatation of small bowel loops. Contrast does not reach the distal small bowel loops at the time of the exam. Distal small bowel loops are normal in caliber. Terminal ileum has a normal appearance. Colonic loops are normal in appearance are unopacified. Appendix is not seen. Vascular/Lymphatic: No evidence for aortic aneurysm. No retroperitoneal or mesenteric adenopathy. Reproductive: Urinary bladder, prostate, and seminal vesicles are normal in appearance. No free pelvic fluid. Other: None Musculoskeletal: Visualized osseous structures have a normal appearance. IMPRESSION: 1. Mild dilatation of proximal small bowel loops. Distal small bowel loops are relatively normal in caliber. Findings raise a question of early or  partial small bowel obstruction. A definite transition point is not identified. 2. Appendix is not well seen. 3. No abscess or free air. Electronically Signed   By: Norva Pavlov M.D.    On: 07/24/2016 17:34   US Renal  Result Date: 07/25/2016 CLINICAL DATA:  Urinary tract infection.  Fever and weakness. EXAM: RENAL / URINARY TRACT ULTRASOUND COMPLETE COMPARISON:  None. FINDINGS: Right Kidney: Length: 12.4 cm. Increased parenchymal echogenicity. No mass or hydronephrosis visualized. Left Kidney: Length: 13.1 cm. Increased parenchymal echogenicity. No mass or hydronephrosis visualized. Bladder: Appears normal for degree of bladder distention. IMPRESSION: 1. No evidence for mass or hydronephrosis. 2. Bilateral echogenic kidneys which may be seen with chronic medical renal disease. Electronically Signed   By: Signa Kell M.D.   On: 07/25/2016 15:30    Anti-infectives: Anti-infectives    Start     Dose/Rate Route Frequency Ordered Stop   07/25/16 1600  cefTRIAXone (ROCEPHIN) 1 g in dextrose 5 % 50 mL IVPB     1 g 100 mL/hr over 30 Minutes Intravenous Every 24 hours 07/25/16 1421        Assessment/Plan:  This patient with 2 bit weeks of abdominal pain mostly in the right lower quadrant. He also has diarrhea. He has a low-grade temperature and elevated white blood cell count albeit slightly improved from admission. At this point he does not have signs of peritonitis but his CT scan failed to identify the appendix. With his 2 week history of pain I doubt that this is appendicitis because there is no sign of rupture or abscess. However because of the contrast did not reach the cecum what I would suggest is that we perform a KUB tomorrow morning to assess for flow of contrast into the colon. I see no sign of bowel obstruction but this would be for completeness. He also requires follow-up exam and possibly even repeat CT scan to look at the appendix. Currently PCR is pending.  Of note the patient complained to me that another physician earlier today he had been very rude to him. All this was during him talking on the phone to me and failing to or refusing to hang up the phone for my  interview.  Lattie Haw, MD, FACS  07/25/2016

## 2016-07-26 ENCOUNTER — Observation Stay: Payer: Self-pay

## 2016-07-26 LAB — URINE CULTURE: Culture: NO GROWTH

## 2016-07-26 MED ORDER — METRONIDAZOLE IN NACL 5-0.79 MG/ML-% IV SOLN
500.0000 mg | Freq: Three times a day (TID) | INTRAVENOUS | Status: DC
  Administered 2016-07-26 – 2016-07-28 (×7): 500 mg via INTRAVENOUS
  Filled 2016-07-26 (×8): qty 100

## 2016-07-26 MED ORDER — METHYLPREDNISOLONE SODIUM SUCC 40 MG IJ SOLR
40.0000 mg | Freq: Two times a day (BID) | INTRAMUSCULAR | Status: DC
  Administered 2016-07-26 – 2016-07-28 (×5): 40 mg via INTRAVENOUS
  Filled 2016-07-26 (×5): qty 1

## 2016-07-26 NOTE — Consult Note (Signed)
Patient with hx of previous abd pain and fever which may or may not have been pancreatitis (I doubt it was) Who now has hx of 2 weeks of abd pain primarily in RLQ with low grade fever, elevated sed rate and CRP and on my exam is most tender in RLQ.  His CT did not show an appendix but it also did not show any sign of recent rupture or fluid pocket.  This all raises the likely possibility that he has Crohn's disease primarily in terminal ileum.  Will do US today to check gall baldder and plan on doing colonoscopy when his urine is negative for cocaine.  Will check urine tomorrow and again Monday if Sunday urine is positive.  Discussed with patient.  Will start antibiotics and steroids today.

## 2016-07-26 NOTE — Progress Notes (Signed)
25 yr old male with abdominal pain and diarrhea x 2 weeks, likely Crohn's disease or other inflammatory etiology.  Patient states pain there intermittently about 8 of 10 when the pain medication wears off.  He has been up to the bathroom and still having some loose stool.  He had some clear liquids this AM but states that he has had some nausea this AM.   Vitals:   07/26/16 1113 07/26/16 1118  BP: 113/62 111/66  Pulse: 66 74  Resp:    Temp:     PE:  Gen: NAD Abd: soft, tender in epigastrium and RLQ, no rebound   CBC Latest Ref Rng & Units 07/25/2016 07/25/2016 07/24/2016  WBC 3.8 - 10.6 K/uL 11.4(H) 13.4(H) 14.5(H)  Hemoglobin 13.0 - 18.0 g/dL 16.114.6 09.614.1 04.515.7  Hematocrit 40.0 - 52.0 % 42.2 40.5 44.8  Platelets 150 - 440 K/uL 155 146(L) 163   CMP Latest Ref Rng & Units 07/25/2016 07/24/2016 02/24/2016  Glucose 65 - 99 mg/dL 92 409(W124(H) 95  BUN 6 - 20 mg/dL 8 8 8   Creatinine 0.61 - 1.24 mg/dL 1.191.05 1.47(W1.26(H) 2.951.20  Sodium 135 - 145 mmol/L 132(L) 130(L) 131(L)  Potassium 3.5 - 5.1 mmol/L 3.2(L) 3.4(L) 3.3(L)  Chloride 101 - 111 mmol/L 98(L) 95(L) 96(L)  CO2 22 - 32 mmol/L 22 25 23   Calcium 8.9 - 10.3 mg/dL 8.2(L) 8.9 8.9  Total Protein 6.5 - 8.1 g/dL - 8.0 7.9  Total Bilirubin 0.3 - 1.2 mg/dL - 0.5 1.0  Alkaline Phos 38 - 126 U/L - 57 61  AST 15 - 41 U/L - 21 19  ALT 17 - 63 U/L - 11(L) 12(L)   CRP: 16.4 Sed rate: 49  A/P:  25 yr old male with abdominal pain and diarrhea x 2 weeks, likely Crohn's disease or other inflammatory etiology especially with elevated CRP and Sed rate.  I do not feel this is appendicitis, although unable to see on CT scan, would expect fluid and even abscess at two weeks out.  Discussed with Dr. Elisabeth PigeonVachhani of medicne and Dr. Markham JordanElliot of GI, who will perform colonoscopy on him in the next couple days.  We will continue to follow.

## 2016-07-26 NOTE — Progress Notes (Signed)
[facility] at [place] NAME: Johnny Grant    MR#:  [id number]  DATE OF BIRTH:  12-Oct-1991  SUBJECTIVE:  CHIEF COMPLAINT:   Chief Complaint  Patient presents with  . Headache  . Nausea  . Emesis  . Diarrhea  . Sore Throat     Came with nausea, vomit, Abdominal pain , diarrhea for last 1 week with fever.   CT abd without acute findings.   UA now reported positive. Stool studies negative, ESR and CRP is elevated.   Still have diarrhea and excessive sweating.  REVIEW OF SYSTEMS:  CONSTITUTIONAL: positive for  fever, fatigue or weakness.  EYES: No blurred or double vision.  EARS, NOSE, AND THROAT: No tinnitus or ear pain.  RESPIRATORY: No cough, shortness of breath, wheezing or hemoptysis.  CARDIOVASCULAR: No chest pain, orthopnea, edema.  GASTROINTESTINAL: positive for nausea, vomiting, diarrhea or abdominal pain.  GENITOURINARY: No dysuria, hematuria.  ENDOCRINE: No polyuria, nocturia,  HEMATOLOGY: No anemia, easy bruising or bleeding SKIN: No rash or lesion. MUSCULOSKELETAL: No joint pain or arthritis.   NEUROLOGIC: No tingling, numbness, weakness.  PSYCHIATRY: No anxiety or depression.   ROS  DRUG ALLERGIES:  No Known Allergies  VITALS:  Blood pressure 108/87, pulse 67, temperature 97.6 F (36.4 C), temperature source Oral, resp. rate 17, height 5' 6"  (1.676 m), weight 67 kg (147 lb 12.8 oz), SpO2 96 %.  PHYSICAL EXAMINATION:  GENERAL:  25 y.o.-year-old patient lying in the bed with no acute distress.  EYES: Pupils equal, round, reactive to light and accommodation. No scleral icterus. Extraocular muscles intact.  HEENT: Head atraumatic, normocephalic. Oropharynx and nasopharynx clear.  NECK:  Supple, no jugular venous distention. No thyroid enlargement, no tenderness.  LUNGS: Normal breath sounds bilaterally, no wheezing, rales,rhonchi or crepitation. No use of accessory muscles of respiration.  CARDIOVASCULAR: S1, S2 normal.  No murmurs, rubs, or gallops.  ABDOMEN: Soft, tender, nondistended. Bowel sounds present. No organomegaly or mass.  EXTREMITIES: No pedal edema, cyanosis, or clubbing.  NEUROLOGIC: Cranial nerves II through XII are intact. Muscle strength 5/5 in all extremities. Sensation intact. Gait not checked.  PSYCHIATRIC: The patient is alert and oriented x 3.  SKIN: No obvious rash, lesion, or ulcer. Multiple tattoo marks.  Physical Exam LABORATORY PANEL:   CBC  Recent Labs Lab 07/25/16 1553  WBC 11.4*  HGB 14.6  HCT 42.2  PLT 155   ------------------------------------------------------------------------------------------------------------------  Chemistries   Recent Labs Lab 07/24/16 1409 07/25/16 0532  NA 130* 132*  K 3.4* 3.2*  CL 95* 98*  CO2 25 22  GLUCOSE 124* 92  BUN 8 8  CREATININE 1.26* 1.05  CALCIUM 8.9 8.2*  AST 21  --   ALT 11*  --   ALKPHOS 57  --   BILITOT 0.5  --    ------------------------------------------------------------------------------------------------------------------  Cardiac Enzymes No results for input(s): TROPONINI in the last 168 hours. ------------------------------------------------------------------------------------------------------------------  RADIOLOGY:  Ct Abdomen Pelvis W Contrast  Result Date: 07/24/2016 CLINICAL DATA:  Right upper quadrant and right lower quadrant pain for 3 days. Vomiting, diarrhea. Blood in stool. EXAM: CT ABDOMEN AND PELVIS WITH CONTRAST TECHNIQUE: Multidetector CT imaging of the abdomen and pelvis was performed using the standard protocol following bolus administration of intravenous contrast. CONTRAST:  149m ISOVUE-300 IOPAMIDOL (ISOVUE-300) INJECTION 61% COMPARISON:  None. FINDINGS: Lower chest: No pulmonary nodules, pleural effusions, or infiltrates. Heart size is normal. No imaged pericardial effusion or significant coronary artery calcifications. Hepatobiliary: Gallbladder has a  normal appearance. Pancreas:  Unremarkable. No pancreatic ductal dilatation or surrounding inflammatory changes. Spleen: Normal in size without focal abnormality. Adrenals/Urinary Tract: Adrenal glands are normal in appearance. Kidneys are symmetric in enhancement and size. No hydronephrosis or focal renal mass. Stomach/Bowel: The stomach is normal in appearance. There is mild dilatation of small bowel loops. Contrast does not reach the distal small bowel loops at the time of the exam. Distal small bowel loops are normal in caliber. Terminal ileum has a normal appearance. Colonic loops are normal in appearance are unopacified. Appendix is not seen. Vascular/Lymphatic: No evidence for aortic aneurysm. No retroperitoneal or mesenteric adenopathy. Reproductive: Urinary bladder, prostate, and seminal vesicles are normal in appearance. No free pelvic fluid. Other: None Musculoskeletal: Visualized osseous structures have a normal appearance. IMPRESSION: 1. Mild dilatation of proximal small bowel loops. Distal small bowel loops are relatively normal in caliber. Findings raise a question of early or partial small bowel obstruction. A definite transition point is not identified. 2. Appendix is not well seen. 3. No abscess or free air. Electronically Signed   By: Nolon Nations M.D.   On: 07/24/2016 17:34   US Renal  Result Date: 07/25/2016 CLINICAL DATA:  Urinary tract infection.  Fever and weakness. EXAM: RENAL / URINARY TRACT ULTRASOUND COMPLETE COMPARISON:  None. FINDINGS: Right Kidney: Length: 12.4 cm. Increased parenchymal echogenicity. No mass or hydronephrosis visualized. Left Kidney: Length: 13.1 cm. Increased parenchymal echogenicity. No mass or hydronephrosis visualized. Bladder: Appears normal for degree of bladder distention. IMPRESSION: 1. No evidence for mass or hydronephrosis. 2. Bilateral echogenic kidneys which may be seen with chronic medical renal disease. Electronically Signed   By: Kerby Moors M.D.   On: 07/25/2016 15:30    Dg Abd 2 Views  Result Date: 07/26/2016 CLINICAL DATA:  Abdominal pain with nausea and vomiting for 3 days EXAM: ABDOMEN - 2 VIEW COMPARISON:  CT abdomen and pelvis July 24, 2016 FINDINGS: Supine and upright images obtained. There is contrast in the colon and rectum. There is no appreciable bowel dilatation or air-fluid level suggesting bowel obstruction. No free air. There are small phleboliths in the pelvis. The lung bases are clear. IMPRESSION: No demonstrable bowel obstruction or free air. Contrast is evident in the colon. Visualized lung bases are clear. Electronically Signed   By: Lowella Grip III M.D.   On: 07/26/2016 10:43    ASSESSMENT AND PLAN:   Principal Problem:   Intractable abdominal pain Active Problems:   Nausea vomiting and diarrhea  *  Intractable abdominal pain, diarrhea - unclear etiology at this time. CT scan very mildly suggestive of possible partial small bowel obstruction. However, clinical symptoms are not is consistent with this diagnosis. Surgical consult felt like Crohn's was a possibility in the differential. GI consult appreciated, when necessary pain control   give IV fluids and supportive care.   Gi panel and c diff ordered on stool- negative.   ESR and CRP high- likely Crohns Dz, Gi will do colonoscopy once urine is negative of Coccaine.  *  Nausea vomiting and diarrhea - IV fluids tonight, when necessary antiemetics  * Fever    Elevated WBCs    UA positive.    Get Ur cx, negative renal sono.    No signs of stone on CT abd.    Rocephin IV.  * Ur drug screen positive.   Pt claims to have taken coccaine only once in last few days, and he does not take it regularly.    All  the records are reviewed and case discussed with Care Management/Social Workerr. Management plans discussed with the patient, family and they are in agreement.  CODE STATUS: full.  TOTAL TIME TAKING CARE OF THIS PATIENT: 35 minutes.   POSSIBLE D/C IN 1-2 DAYS,  DEPENDING ON CLINICAL CONDITION.   Vaughan Basta M.D on 07/26/2016   Between 7am to 6pm - Pager - [phone]  After 6pm go to [email or web address] - password [email or web address] [facility]  Office  [phone]  CC: Primary care physician; No PCP Per Patient  Note: This dictation was prepared with Dragon dictation along with smaller phrase technology. Any transcriptional errors that result from this process are unintentional.

## 2016-07-27 LAB — CBC WITH DIFFERENTIAL/PLATELET
BASOS ABS: 0 10*3/uL (ref 0–0.1)
Basophils Relative: 0 %
Eosinophils Absolute: 0.1 10*3/uL (ref 0–0.7)
Eosinophils Relative: 1 %
HEMATOCRIT: 41.5 % (ref 40.0–52.0)
Hemoglobin: 13.9 g/dL (ref 13.0–18.0)
LYMPHS ABS: 2.2 10*3/uL (ref 1.0–3.6)
LYMPHS PCT: 24 %
MCH: 32.5 pg (ref 26.0–34.0)
MCHC: 33.5 g/dL (ref 32.0–36.0)
MCV: 97 fL (ref 80.0–100.0)
MONO ABS: 1.7 10*3/uL — AB (ref 0.2–1.0)
Monocytes Relative: 19 %
NEUTROS ABS: 5 10*3/uL (ref 1.4–6.5)
NEUTROS PCT: 56 %
Platelets: 149 10*3/uL — ABNORMAL LOW (ref 150–440)
RBC: 4.28 MIL/uL — ABNORMAL LOW (ref 4.40–5.90)
RDW: 13.9 % (ref 11.5–14.5)
WBC: 8.9 10*3/uL (ref 3.8–10.6)

## 2016-07-27 LAB — URINE DRUG SCREEN, QUALITATIVE (ARMC ONLY)
AMPHETAMINES, UR SCREEN: NOT DETECTED
BENZODIAZEPINE, UR SCRN: NOT DETECTED
Barbiturates, Ur Screen: NOT DETECTED
Cannabinoid 50 Ng, Ur ~~LOC~~: POSITIVE — AB
Cocaine Metabolite,Ur ~~LOC~~: NOT DETECTED
MDMA (ECSTASY) UR SCREEN: NOT DETECTED
Methadone Scn, Ur: NOT DETECTED
OPIATE, UR SCREEN: NOT DETECTED
PHENCYCLIDINE (PCP) UR S: NOT DETECTED
Tricyclic, Ur Screen: NOT DETECTED

## 2016-07-27 MED ORDER — POLYETHYLENE GLYCOL 3350 17 GM/SCOOP PO POWD
1.0000 | ORAL | Status: AC
  Administered 2016-07-27 (×2): 255 g via ORAL
  Filled 2016-07-27: qty 255

## 2016-07-27 NOTE — Progress Notes (Signed)
25 yr old male with abdominal pain and diarrhea x 2 weeks, likely Crohn's disease or other inflammatory etiology.  Patient states pain better today. still having some loose stool.  Vitals:   07/27/16 1316 07/27/16 1342  BP: 113/75   Pulse: (!) 39 62  Resp:    Temp: 97.5 F (36.4 C)    PE:  Gen: NAD Abd: soft, tender in epigastrium and RLQ, no rebound   CBC Latest Ref Rng & Units 07/26/2016 07/25/2016 07/25/2016  WBC 3.8 - 10.6 K/uL 8.9 11.4(H) 13.4(H)  Hemoglobin 13.0 - 18.0 g/dL 40.913.9 81.114.6 91.414.1  Hematocrit 40.0 - 52.0 % 41.5 42.2 40.5  Platelets 150 - 440 K/uL 149(L) 155 146(L)   CMP Latest Ref Rng & Units 07/25/2016 07/24/2016 02/24/2016  Glucose 65 - 99 mg/dL 92 782(N124(H) 95  BUN 6 - 20 mg/dL 8 8 8   Creatinine 0.61 - 1.24 mg/dL 5.621.05 1.30(Q1.26(H) 6.571.20  Sodium 135 - 145 mmol/L 132(L) 130(L) 131(L)  Potassium 3.5 - 5.1 mmol/L 3.2(L) 3.4(L) 3.3(L)  Chloride 101 - 111 mmol/L 98(L) 95(L) 96(L)  CO2 22 - 32 mmol/L 22 25 23   Calcium 8.9 - 10.3 mg/dL 8.2(L) 8.9 8.9  Total Protein 6.5 - 8.1 g/dL - 8.0 7.9  Total Bilirubin 0.3 - 1.2 mg/dL - 0.5 1.0  Alkaline Phos 38 - 126 U/L - 57 61  AST 15 - 41 U/L - 21 19  ALT 17 - 63 U/L - 11(L) 12(L)    A/P:  25 yr old male with abdominal pain and diarrhea x 2 weeks, likely Crohn's disease, he is to have colonoscopy with GI tomorrow.  No acute surgical needs.

## 2016-07-27 NOTE — Consult Note (Signed)
Patient urine now neg for cocaine so will proceed with colonoscopy and prep for tomorrow.  He is feeling a little better and no pain with coughing now.  He may be responding to the steroids and antibiotics.  Will prep with Miralax and Gatorade.

## 2016-07-27 NOTE — Progress Notes (Signed)
[facility] at [place] NAME: Johnny Grant    MR#:  [id number]  DATE OF BIRTH:  06/11/1991  SUBJECTIVE:  CHIEF COMPLAINT:   Chief Complaint  Patient presents with  . Headache  . Nausea  . Emesis  . Diarrhea  . Sore Throat     Came with nausea, vomit, Abdominal pain , diarrhea for last 1 week with fever.   CT abd without acute findings.   UA now reported positive. Stool studies negative, ESR and CRP is elevated.   Now have less pain and diarrhea.   Colonoscopy is planned for tomorrow.  REVIEW OF SYSTEMS:  CONSTITUTIONAL: positive for  fever, fatigue or weakness.  EYES: No blurred or double vision.  EARS, NOSE, AND THROAT: No tinnitus or ear pain.  RESPIRATORY: No cough, shortness of breath, wheezing or hemoptysis.  CARDIOVASCULAR: No chest pain, orthopnea, edema.  GASTROINTESTINAL: positive for nausea, vomiting, diarrhea or abdominal pain.  GENITOURINARY: No dysuria, hematuria.  ENDOCRINE: No polyuria, nocturia,  HEMATOLOGY: No anemia, easy bruising or bleeding SKIN: No rash or lesion. MUSCULOSKELETAL: No joint pain or arthritis.   NEUROLOGIC: No tingling, numbness, weakness.  PSYCHIATRY: No anxiety or depression.   ROS  DRUG ALLERGIES:  No Known Allergies  VITALS:  Blood pressure 113/75, pulse 62, temperature 97.5 F (36.4 C), temperature source Oral, resp. rate 17, height 5' 6"  (1.676 m), weight 67 kg (147 lb 12.8 oz), SpO2 96 %.  PHYSICAL EXAMINATION:  GENERAL:  25 y.o.-year-old patient lying in the bed with no acute distress.  EYES: Pupils equal, round, reactive to light and accommodation. No scleral icterus. Extraocular muscles intact.  HEENT: Head atraumatic, normocephalic. Oropharynx and nasopharynx clear.  NECK:  Supple, no jugular venous distention. No thyroid enlargement, no tenderness.  LUNGS: Normal breath sounds bilaterally, no wheezing, rales,rhonchi or crepitation. No use of accessory muscles of respiration.   CARDIOVASCULAR: S1, S2 normal. No murmurs, rubs, or gallops.  ABDOMEN: Soft, tender, nondistended. Bowel sounds present. No organomegaly or mass.  EXTREMITIES: No pedal edema, cyanosis, or clubbing.  NEUROLOGIC: Cranial nerves II through XII are intact. Muscle strength 5/5 in all extremities. Sensation intact. Gait not checked.  PSYCHIATRIC: The patient is alert and oriented x 3.  SKIN: No obvious rash, lesion, or ulcer. Multiple tattoo marks.  Physical Exam LABORATORY PANEL:   CBC  Recent Labs Lab 07/26/16 0619  WBC 8.9  HGB 13.9  HCT 41.5  PLT 149*   ------------------------------------------------------------------------------------------------------------------  Chemistries   Recent Labs Lab 07/24/16 1409 07/25/16 0532  NA 130* 132*  K 3.4* 3.2*  CL 95* 98*  CO2 25 22  GLUCOSE 124* 92  BUN 8 8  CREATININE 1.26* 1.05  CALCIUM 8.9 8.2*  AST 21  --   ALT 11*  --   ALKPHOS 57  --   BILITOT 0.5  --    ------------------------------------------------------------------------------------------------------------------  Cardiac Enzymes No results for input(s): TROPONINI in the last 168 hours. ------------------------------------------------------------------------------------------------------------------  RADIOLOGY:  US Abdomen Complete  Result Date: 07/26/2016 CLINICAL DATA:  Abdominal pain for 2 weeks. EXAM: ABDOMEN ULTRASOUND COMPLETE COMPARISON:  CT scan July 24, 2016 FINDINGS: Gallbladder: No gallstones or wall thickening visualized. No sonographic Murphy sign noted by sonographer. Common bile duct: Diameter: 3.7 mm Liver: No focal lesion identified. Within normal limits in parenchymal echogenicity. IVC: No abnormality visualized. Pancreas: Visualized portion unremarkable. Spleen: Size and appearance within normal limits. Right Kidney: Length: 13 cm. Increased cortical echogenicity, unchanged since yesterday's ultrasound. Left Kidney:  Length: 13.6 cm. Increased  cortical echogenicity. No significant hydronephrosis. Abdominal aorta: No aneurysm visualized. Other findings: None. IMPRESSION: 1. No acute abnormalities to explain the patient's symptoms. Electronically Signed   By: Dorise Bullion III M.D   On: 07/26/2016 17:17   US Renal  Result Date: 07/25/2016 CLINICAL DATA:  Urinary tract infection.  Fever and weakness. EXAM: RENAL / URINARY TRACT ULTRASOUND COMPLETE COMPARISON:  None. FINDINGS: Right Kidney: Length: 12.4 cm. Increased parenchymal echogenicity. No mass or hydronephrosis visualized. Left Kidney: Length: 13.1 cm. Increased parenchymal echogenicity. No mass or hydronephrosis visualized. Bladder: Appears normal for degree of bladder distention. IMPRESSION: 1. No evidence for mass or hydronephrosis. 2. Bilateral echogenic kidneys which may be seen with chronic medical renal disease. Electronically Signed   By: Kerby Moors M.D.   On: 07/25/2016 15:30   Dg Abd 2 Views  Result Date: 07/26/2016 CLINICAL DATA:  Abdominal pain with nausea and vomiting for 3 days EXAM: ABDOMEN - 2 VIEW COMPARISON:  CT abdomen and pelvis July 24, 2016 FINDINGS: Supine and upright images obtained. There is contrast in the colon and rectum. There is no appreciable bowel dilatation or air-fluid level suggesting bowel obstruction. No free air. There are small phleboliths in the pelvis. The lung bases are clear. IMPRESSION: No demonstrable bowel obstruction or free air. Contrast is evident in the colon. Visualized lung bases are clear. Electronically Signed   By: Lowella Grip III M.D.   On: 07/26/2016 10:43    ASSESSMENT AND PLAN:   Principal Problem:   Intractable abdominal pain Active Problems:   Nausea vomiting and diarrhea  *  Intractable abdominal pain, diarrhea - unclear etiology at this time.  possibel crohns  surgery and GI consult appreciated, when necessary pain control   give IV fluids and supportive care.   Gi panel and c diff ordered on stool-  negative.   ESR and CRP high- likely Crohns Dz, Gi will do colonoscopy 07/28/16.  *  Nausea vomiting and diarrhea - IV fluids tonight, when necessary antiemetics  * Fever    Elevated WBCs    UA positive.    Get Ur cx, negative renal sono.    No signs of stone on CT abd.    Rocephin IV.  * Ur drug screen positive.   Pt claims to have taken coccaine only once in last few days, and he does not take it regularly.    All the records are reviewed and case discussed with Care Management/Social Workerr. Management plans discussed with the patient, family and they are in agreement.  CODE STATUS: full.  TOTAL TIME TAKING CARE OF THIS PATIENT: 35 minutes.   POSSIBLE D/C IN 1-2 DAYS, DEPENDING ON CLINICAL CONDITION.   Vaughan Basta M.D on 07/27/2016   Between 7am to 6pm - Pager - [phone]  After 6pm go to [email or web address] - password [email or web address] [facility]  Office  [phone]  CC: Primary care physician; No PCP Per Patient  Note: This dictation was prepared with Dragon dictation along with smaller phrase technology. Any transcriptional errors that result from this process are unintentional.

## 2016-07-28 ENCOUNTER — Observation Stay: Payer: Self-pay | Admitting: Anesthesiology

## 2016-07-28 ENCOUNTER — Encounter
Admission: EM | Disposition: A | Discharge status: Home / Self Care | Payer: Self-pay | Source: Home / Self Care | Attending: Emergency Medicine

## 2016-07-28 ENCOUNTER — Encounter: Payer: Self-pay | Admitting: Anesthesiology

## 2016-07-28 HISTORY — PX: COLONOSCOPY WITH PROPOFOL: SHX5780

## 2016-07-28 LAB — BASIC METABOLIC PANEL
Anion gap: 6 (ref 5–15)
BUN: 8 mg/dL (ref 6–20)
CALCIUM: 9.3 mg/dL (ref 8.9–10.3)
CO2: 27 mmol/L (ref 22–32)
CREATININE: 0.74 mg/dL (ref 0.61–1.24)
Chloride: 106 mmol/L (ref 101–111)
GFR calc Af Amer: >60 mL/min (ref >60)
Glucose, Bld: 112 mg/dL — ABNORMAL HIGH (ref 65–99)
POTASSIUM: 4.7 mmol/L (ref 3.5–5.1)
SODIUM: 139 mmol/L (ref 135–145)

## 2016-07-28 LAB — URINE DRUG SCREEN, QUALITATIVE (ARMC ONLY)
Amphetamines, Ur Screen: NOT DETECTED
BARBITURATES, UR SCREEN: NOT DETECTED
BENZODIAZEPINE, UR SCRN: NOT DETECTED
CANNABINOID 50 NG, UR ~~LOC~~: NOT DETECTED
Cocaine Metabolite,Ur ~~LOC~~: NOT DETECTED
MDMA (ECSTASY) UR SCREEN: NOT DETECTED
Methadone Scn, Ur: NOT DETECTED
Opiate, Ur Screen: NOT DETECTED
Phencyclidine (PCP) Ur S: NOT DETECTED
TRICYCLIC, UR SCREEN: NOT DETECTED

## 2016-07-28 SURGERY — COLONOSCOPY WITH PROPOFOL
Anesthesia: General

## 2016-07-28 MED ORDER — CIPROFLOXACIN HCL 500 MG PO TABS: 500.0000 mg | ORAL_TABLET | Freq: Two times a day (BID) | ORAL | 0 refills | Status: AC

## 2016-07-28 MED ORDER — EPHEDRINE SULFATE 50 MG/ML IJ SOLN
INTRAMUSCULAR | Status: DC | PRN
  Administered 2016-07-28: 10 mg via INTRAVENOUS

## 2016-07-28 MED ORDER — PROPOFOL 500 MG/50ML IV EMUL
INTRAVENOUS | Status: DC | PRN
  Administered 2016-07-28: 120 ug/kg/min via INTRAVENOUS

## 2016-07-28 MED ORDER — SODIUM CHLORIDE 0.9 % IV SOLN
INTRAVENOUS | Status: DC
  Administered 2016-07-28: 1000 mL via INTRAVENOUS
  Administered 2016-07-28: 12:00:00 via INTRAVENOUS

## 2016-07-28 MED ORDER — OXYCODONE HCL 5 MG PO TABS: 5.0000 mg | ORAL_TABLET | ORAL | 0 refills | Status: DC | PRN

## 2016-07-28 MED ORDER — METRONIDAZOLE 500 MG PO TABS: 500.0000 mg | ORAL_TABLET | Freq: Three times a day (TID) | ORAL | 0 refills | Status: AC

## 2016-07-28 MED ORDER — FENTANYL CITRATE (PF) 100 MCG/2ML IJ SOLN
INTRAMUSCULAR | Status: DC | PRN
  Administered 2016-07-28: 50 ug via INTRAVENOUS

## 2016-07-28 MED ORDER — MIDAZOLAM HCL 2 MG/2ML IJ SOLN
INTRAMUSCULAR | Status: DC | PRN
  Administered 2016-07-28: 1 mg via INTRAVENOUS

## 2016-07-28 NOTE — Anesthesia Procedure Notes (Signed)
Performed by: COOK-MARTIN, Taejah Ohalloran Pre-anesthesia Checklist: Patient identified, Emergency Drugs available, Suction available, Patient being monitored and Timeout performed Patient Re-evaluated:Patient Re-evaluated prior to inductionOxygen Delivery Method: Nasal cannula Preoxygenation: Pre-oxygenation with 100% oxygen Intubation Type: IV induction Placement Confirmation: positive ETCO2 and CO2 detector       

## 2016-07-28 NOTE — Progress Notes (Signed)
Pt finished 2nd bowel prep

## 2016-07-28 NOTE — Progress Notes (Signed)
Alert and oriented.  . No signs of acute distress. Discharge instructions given.Tolerating diet.  Patient verbalized understanding. No other issues noted at this time.

## 2016-07-28 NOTE — Addendum Note (Signed)
Addendum  created 07/28/16 1231 by Tonia Ghentindy Cook-Martin   Anesthesia Attestations filed, Anesthesia Event deleted, Anesthesia Event edited, Anesthesia Intra Blocks edited, Anesthesia Intra Flowsheets edited, Anesthesia Intra LDAs edited, Anesthesia Intra Meds edited, Anesthesia Staff edited, Charge Capture section accepted, Child order released for a procedure order, LDA properties accepted, Patient device added, Patient device removed, Sign clinical note

## 2016-07-28 NOTE — Transfer of Care (Signed)
Immediate Anesthesia Transfer of Care Note  Patient: Johnny Grant  Procedure(s) Performed: Procedure(s): COLONOSCOPY WITH PROPOFOL (N/A)  Patient Location: PACU  Anesthesia Type:General  Level of Consciousness: awake and sedated  Airway & Oxygen Therapy: Patient Spontanous Breathing and Patient connected to nasal cannula oxygen  Post-op Assessment: Report given to RN and Post -op Vital signs reviewed and stable  Post vital signs: Reviewed and stable  Last Vitals:  Vitals:   07/28/16 0801 07/28/16 0802  BP: (!) 118/102 114/73  Pulse: (!) 49 (!) 55  Resp: 16   Temp: 36.4 C     Last Pain:  Vitals:   07/28/16 0801  TempSrc: Oral  PainSc:       Patients Stated Pain Goal: 0 (07/26/16 0529)  Complications: No apparent anesthesia complications

## 2016-07-28 NOTE — Anesthesia Postprocedure Evaluation (Signed)
Anesthesia Post Note  Patient: Johnny Grant  Procedure(s) Performed: Procedure(s) (LRB): COLONOSCOPY WITH PROPOFOL (N/A)  Patient location during evaluation: Endoscopy Anesthesia Type: General Level of consciousness: awake and alert and oriented Pain management: pain level controlled Vital Signs Assessment: post-procedure vital signs reviewed and stable Respiratory status: spontaneous breathing, nonlabored ventilation and respiratory function stable Cardiovascular status: blood pressure returned to baseline and stable Postop Assessment: no signs of nausea or vomiting Anesthetic complications: no    Last Vitals:  Vitals:   07/28/16 1250 07/28/16 1300  BP: (!) 107/57 110/65  Pulse: 62 (!) 51  Resp: 14 15  Temp:      Last Pain:  Vitals:   07/28/16 1240  TempSrc:   PainSc: Asleep                 Zakia Sainato

## 2016-07-28 NOTE — Consult Note (Signed)
Patient had tall peaked T waves on cardiac monitor and some bradycardia.  Will cancel colonoscopy and get 12 lead EKG and stat met B and urine for drug screen.

## 2016-07-28 NOTE — Progress Notes (Signed)
   [facility] [address] [place], [place] [address]  July 28, 2016  Patient:  Brock Rayler Kawahara Date of Birth: 11/18/1990 Date of Visit:  07/24/2016  To Whom it May Concern:  Please excuse Melanee Spryyler S Haros from work from 07/24/2016 until 07/28/16 as he was admitted to the [facility] for medical treatment and has been receiving appropriate care. He may return to work on 07/30/16, sooner if he feels he is able to return sooner than this date.      Please don't hesitate to contact me with questions or concerns by calling  [phone] and asking them to page me directly.   Marge Duncansave Hower, MD

## 2016-07-28 NOTE — Anesthesia Preprocedure Evaluation (Signed)
Anesthesia Evaluation  Patient identified by MRN, date of birth, ID band Patient awake    Reviewed: Allergy & Precautions, NPO status , Patient's Chart, lab work & pertinent test results  History of Anesthesia Complications Negative for: history of anesthetic complications  Airway Mallampati: I  TM Distance: >3 FB Neck ROM: Full    Dental  (+) Poor Dentition   Pulmonary neg sleep apnea, neg COPD, Current Smoker,    breath sounds clear to auscultation- rhonchi (-) wheezing      Cardiovascular Exercise Tolerance: Good (-) hypertension(-) CAD and (-) Past MI  Rhythm:Regular Rate:Normal - Systolic murmurs and - Diastolic murmurs    Neuro/Psych negative neurological ROS  negative psych ROS   GI/Hepatic Neg liver ROS, Abdominal pain concerning for IBD    Endo/Other  negative endocrine ROS  Renal/GU negative Renal ROS     Musculoskeletal negative musculoskeletal ROS (+)   Abdominal (+) - obese,   Peds  Hematology negative hematology ROS (+)   Anesthesia Other Findings   Reproductive/Obstetrics                             Anesthesia Physical Anesthesia Plan  ASA: II  Anesthesia Plan: General   Post-op Pain Management:    Induction: Intravenous  Airway Management Planned: Natural Airway  Additional Equipment:   Intra-op Plan:   Post-operative Plan:   Informed Consent: I have reviewed the patients History and Physical, chart, labs and discussed the procedure including the risks, benefits and alternatives for the proposed anesthesia with the patient or authorized representative who has indicated his/her understanding and acceptance.   Dental advisory given  Plan Discussed with: CRNA and Anesthesiologist  Anesthesia Plan Comments:         Anesthesia Quick Evaluation

## 2016-07-28 NOTE — Progress Notes (Signed)
[facility] - [place] at [facility]   PATIENT NAME: Johnny Grant    MRN#:  [id number]  DATE OF BIRTH:  07/28/1991  SUBJECTIVE:  Hospital Day: 0 days Johnny Grant is a 25 y.o. male presenting with Headache; Nausea; Emesis; Diarrhea; and Sore Throat .   Overnight events: No acute overnight events Interval Events: No complaints  REVIEW OF SYSTEMS:  CONSTITUTIONAL: No fever, fatigue or weakness.  EYES: No blurred or double vision.  EARS, NOSE, AND THROAT: No tinnitus or ear pain.  RESPIRATORY: No cough, shortness of breath, wheezing or hemoptysis.  CARDIOVASCULAR: No chest pain, orthopnea, edema.  GASTROINTESTINAL: No nausea, vomiting, diarrhea or abdominal pain.  GENITOURINARY: No dysuria, hematuria.  ENDOCRINE: No polyuria, nocturia,  HEMATOLOGY: No anemia, easy bruising or bleeding SKIN: No rash or lesion. MUSCULOSKELETAL: No joint pain or arthritis.   NEUROLOGIC: No tingling, numbness, weakness.  PSYCHIATRY: No anxiety or depression.   DRUG ALLERGIES:  No Known Allergies  VITALS:  Blood pressure 110/65, pulse (!) 51, temperature 97.3 F (36.3 C), temperature source Tympanic, resp. rate 15, height 5\' 6"  (1.676 m), weight 67 kg (147 lb 12.8 oz), SpO2 100 %.  PHYSICAL EXAMINATION:  VITAL SIGNS: Vitals:   07/28/16 1250 07/28/16 1300  BP: (!) 107/57 110/65  Pulse: 62 (!) 51  Resp: 14 15  Temp:     GENERAL:25 y.o.male currently in no acute distress.  HEAD: Normocephalic, atraumatic.  EYES: Pupils equal, round, reactive to light. Extraocular muscles intact. No scleral icterus.  MOUTH: Moist mucosal membrane. Dentition intact. No abscess noted.  EAR, NOSE, THROAT: Clear without exudates. No external lesions.  NECK: Supple. No thyromegaly. No nodules. No JVD.  PULMONARY: Clear to ascultation, without wheeze rails or rhonci. No use of accessory muscles, Good respiratory effort. good air entry bilaterally CHEST: Nontender to palpation.   CARDIOVASCULAR: S1 and S2. Regular rate and rhythm. No murmurs, rubs, or gallops. No edema. Pedal pulses 2+ bilaterally.  GASTROINTESTINAL: Soft, nontender, nondistended. No masses. Positive bowel sounds. No hepatosplenomegaly.  MUSCULOSKELETAL: No swelling, clubbing, or edema. Range of motion full in all extremities.  NEUROLOGIC: Cranial nerves II through XII are intact. No gross focal neurological deficits. Sensation intact. Reflexes intact.  SKIN: No ulceration, lesions, rashes, or cyanosis. Skin warm and dry. Turgor intact.  PSYCHIATRIC: Mood, affect within normal limits. The patient is awake, alert and oriented x 3. Insight, judgment intact.      LABORATORY PANEL:   CBC  Recent Labs Lab 07/26/16 0619  WBC 8.9  HGB 13.9  HCT 41.5  PLT 149*   ------------------------------------------------------------------------------------------------------------------  Chemistries   Recent Labs Lab 07/24/16 1409  07/28/16 0929  NA 130*  < > 139  K 3.4*  < > 4.7  CL 95*  < > 106  CO2 25  < > 27  GLUCOSE 124*  < > 112*  BUN 8  < > 8  CREATININE 1.26*  < > 0.74  CALCIUM 8.9  < > 9.3  AST 21  --   --   ALT 11*  --   --   ALKPHOS 57  --   --   BILITOT 0.5  --   --   < > = values in this interval not displayed. ------------------------------------------------------------------------------------------------------------------  Cardiac Enzymes No results for input(s): TROPONINI in the last 168 hours. ------------------------------------------------------------------------------------------------------------------  RADIOLOGY:  US Abdomen Complete  Result Date: 07/26/2016 CLINICAL DATA:  Abdominal pain for 2 weeks. EXAM: ABDOMEN ULTRASOUND COMPLETE COMPARISON:  CT scan July 24, 2016 FINDINGS: Gallbladder: No gallstones or wall thickening visualized. No sonographic Murphy sign noted by sonographer. Common bile duct: Diameter: 3.7 mm Liver: No focal lesion identified. Within normal  limits in parenchymal echogenicity. IVC: No abnormality visualized. Pancreas: Visualized portion unremarkable. Spleen: Size and appearance within normal limits. Right Kidney: Length: 13 cm. Increased cortical echogenicity, unchanged since yesterday's ultrasound. Left Kidney: Length: 13.6 cm. Increased cortical echogenicity. No significant hydronephrosis. Abdominal aorta: No aneurysm visualized. Other findings: None. IMPRESSION: 1. No acute abnormalities to explain the patient's symptoms. Electronically Signed   By: Gerome Samavid  Williams III M.D   On: 07/26/2016 17:17    EKG:   Orders placed or performed during the hospital encounter of 07/24/16  . EKG 12-Lead  . EKG 12-Lead    ASSESSMENT AND PLAN:   Johnny Grant is a 25 y.o. male presenting with Headache; Nausea; Emesis; Diarrhea; and Sore Throat . Admitted 07/24/2016 : Day #: 0 days 1. Intractable abdominal pain: Somewhat improved for colonoscopy today continue antibiotics-if continued improvement plan discharge tomorrow   All the records are reviewed and case discussed with Care Management/Social Workerr. Management plans discussed with the patient, family and they are in agreement.  CODE STATUS: full TOTAL TIME TAKING CARE OF THIS PATIENT: 28 minutes.   POSSIBLE D/C IN 1DAYS, DEPENDING ON CLINICAL CONDITION.   Lennyn Gange,  Mardi MainlandDavid K M.D on 07/28/2016 at 1:03 PM  Between 7am to 6pm - Pager - [phone]  After 6pm: House Pager: [phone]  [facility]  Office  [phone]  CC: Primary care physician; No PCP Per Patient

## 2016-07-28 NOTE — Addendum Note (Signed)
Addendum  created 07/28/16 1325 by Alver FisherAmy Celise Bazar, MD   Anesthesia Attestations deleted, Anesthesia Attestations filed, Sign clinical note

## 2016-07-28 NOTE — Discharge Summary (Signed)
[facility] - [place] at [facility]   PATIENT NAME: Johnny Grant    MR#:  [id number]  DATE OF BIRTH:  [birth year]  DATE OF ADMISSION:  07/24/2016 ADMITTING PHYSICIAN: Oralia Manis, MD  DATE OF DISCHARGE: 07/28/16  PRIMARY CARE PHYSICIAN: No PCP Per Patient    ADMISSION DIAGNOSIS:  Nausea vomiting and diarrhea [R11.2, R19.7] Abdominal pain, unspecified abdominal location [R10.9]  DISCHARGE DIAGNOSIS:  Principal Problem:   Intractable abdominal pain Active Problems:   Nausea vomiting and diarrhea   SECONDARY DIAGNOSIS:   Past Medical History:  Diagnosis Date  . ADD (attention deficit disorder)   . Patient denies medical problems     HOSPITAL COURSE:  Johnny Grant  is a 25 y.o. male admitted 07/24/2016 with chief complaint Headache; Nausea; Emesis; Diarrhea; and Sore Throat . Please see H&P performed by Oralia Manis, MD for further information. Patient presented with the above symptoms. Evaluated by gastroenterology during his stay and underwent colonoscopy. No acute abnormalities noted. Recommended to finish course of antibiotics for colitis.  DISCHARGE CONDITIONS:   stable  CONSULTS OBTAINED:  Treatment Team:  Gladis Riffle, MD Scot Jun, MD  DRUG ALLERGIES:  No Known Allergies  DISCHARGE MEDICATIONS:   Current Discharge Medication List    START taking these medications   Details  ciprofloxacin (CIPRO) 500 MG tablet Take 1 tablet (500 mg total) by mouth 2 (two) times daily. Qty: 10 tablet, Refills: 0    metroNIDAZOLE (FLAGYL) 500 MG tablet Take 1 tablet (500 mg total) by mouth 3 (three) times daily. Qty: 15 tablet, Refills: 0    oxyCODONE (OXY IR/ROXICODONE) 5 MG immediate release tablet Take 1 tablet (5 mg total) by mouth every 4 (four) hours as needed for moderate pain. Qty: 30 tablet, Refills: 0         DISCHARGE INSTRUCTIONS:    DIET:  Regular diet  DISCHARGE CONDITION:  Good  ACTIVITY:  Activity as  tolerated  OXYGEN:  Home Oxygen: No.   Oxygen Delivery: room air  DISCHARGE LOCATION:  home   If you experience worsening of your admission symptoms, develop shortness of breath, life threatening emergency, suicidal or homicidal thoughts you must seek medical attention immediately by calling 911 or calling your MD immediately  if symptoms less severe.  You Must read complete instructions/literature along with all the possible adverse reactions/side effects for all the Medicines you take and that have been prescribed to you. Take any new Medicines after you have completely understood and accpet all the possible adverse reactions/side effects.   Please note  You were cared for by a hospitalist during your hospital stay. If you have any questions about your discharge medications or the care you received while you were in the hospital after you are discharged, you can call the unit and asked to speak with the hospitalist on call if the hospitalist that took care of you is not available. Once you are discharged, your primary care physician will handle any further medical issues. Please note that NO REFILLS for any discharge medications will be authorized once you are discharged, as it is imperative that you return to your primary care physician (or establish a relationship with a primary care physician if you do not have one) for your aftercare needs so that they can reassess your need for medications and monitor your lab values.    On the day of Discharge:   VITAL SIGNS:  Blood pressure (!) 112/58, pulse (!) 50, temperature 97.3 F (  36.3 C), temperature source Tympanic, resp. rate 16, height 5\' 6"  (1.676 m), weight 67 kg (147 lb 12.8 oz), SpO2 100 %.  I/O:   Intake/Output Summary (Last 24 hours) at 07/28/16 1350 Last data filed at 07/28/16 1210  Gross per 24 hour  Intake          2541.07 ml  Output                1 ml  Net          2540.07 ml    PHYSICAL EXAMINATION:  GENERAL:  25  y.o.-year-old patient lying in the bed with no acute distress.  EYES: Pupils equal, round, reactive to light and accommodation. No scleral icterus. Extraocular muscles intact.  HEENT: Head atraumatic, normocephalic. Oropharynx and nasopharynx clear.  NECK:  Supple, no jugular venous distention. No thyroid enlargement, no tenderness.  LUNGS: Normal breath sounds bilaterally, no wheezing, rales,rhonchi or crepitation. No use of accessory muscles of respiration.  CARDIOVASCULAR: S1, S2 normal. No murmurs, rubs, or gallops.  ABDOMEN: Soft, non-tender, non-distended. Bowel sounds present. No organomegaly or mass.  EXTREMITIES: No pedal edema, cyanosis, or clubbing.  NEUROLOGIC: Cranial nerves II through XII are intact. Muscle strength 5/5 in all extremities. Sensation intact. Gait not checked.  PSYCHIATRIC: The patient is alert and oriented x 3.  SKIN: No obvious rash, lesion, or ulcer.   DATA REVIEW:   CBC  Recent Labs Lab 07/26/16 0619  WBC 8.9  HGB 13.9  HCT 41.5  PLT 149*    Chemistries   Recent Labs Lab 07/24/16 1409  07/28/16 0929  NA 130*  < > 139  K 3.4*  < > 4.7  CL 95*  < > 106  CO2 25  < > 27  GLUCOSE 124*  < > 112*  BUN 8  < > 8  CREATININE 1.26*  < > 0.74  CALCIUM 8.9  < > 9.3  AST 21  --   --   ALT 11*  --   --   ALKPHOS 57  --   --   BILITOT 0.5  --   --   < > = values in this interval not displayed.  Cardiac Enzymes No results for input(s): TROPONINI in the last 168 hours.  Microbiology Results  Results for orders placed or performed during the hospital encounter of 07/24/16  Urine culture     Status: None   Collection Time: 07/25/16  9:30 AM  Result Value Ref Range Status   Specimen Description URINE, RANDOM  Final   Special Requests NONE  Final   Culture NO GROWTH Performed at [facility]   Final   Report Status 07/26/2016 FINAL  Final  CULTURE, BLOOD (ROUTINE X 2) w Reflex to ID Panel     Status: None (Preliminary result)   Collection  Time: 07/25/16 11:15 AM  Result Value Ref Range Status   Specimen Description BLOOD RIGHT AC  Final   Special Requests BOTTLES DRAWN AEROBIC AND ANAEROBIC 4CC  Final   Culture NO GROWTH 3 DAYS  Final   Report Status PENDING  Incomplete  CULTURE, BLOOD (ROUTINE X 2) w Reflex to ID Panel     Status: None (Preliminary result)   Collection Time: 07/25/16 11:27 AM  Result Value Ref Range Status   Specimen Description BLOOD LEFT ARM  Final   Special Requests BOTTLES DRAWN AEROBIC AND ANAEROBIC 4CC  Final   Culture NO GROWTH 3 DAYS  Final   Report Status  PENDING  Incomplete  Gastrointestinal Panel by PCR , Stool     Status: None   Collection Time: 07/25/16  9:00 PM  Result Value Ref Range Status   Campylobacter species NOT DETECTED NOT DETECTED Final   Plesimonas shigelloides NOT DETECTED NOT DETECTED Final   Salmonella species NOT DETECTED NOT DETECTED Final   Yersinia enterocolitica NOT DETECTED NOT DETECTED Final   Vibrio species NOT DETECTED NOT DETECTED Final   Vibrio cholerae NOT DETECTED NOT DETECTED Final   Enteroaggregative E coli (EAEC) NOT DETECTED NOT DETECTED Final   Enteropathogenic E coli (EPEC) NOT DETECTED NOT DETECTED Final   Enterotoxigenic E coli (ETEC) NOT DETECTED NOT DETECTED Final   Shiga like toxin producing E coli (STEC) NOT DETECTED NOT DETECTED Final   E. coli O157 NOT DETECTED NOT DETECTED Final   Shigella/Enteroinvasive E coli (EIEC) NOT DETECTED NOT DETECTED Final   Cryptosporidium NOT DETECTED NOT DETECTED Final   Cyclospora cayetanensis NOT DETECTED NOT DETECTED Final   Entamoeba histolytica NOT DETECTED NOT DETECTED Final   Giardia lamblia NOT DETECTED NOT DETECTED Final   Adenovirus F40/41 NOT DETECTED NOT DETECTED Final   Astrovirus NOT DETECTED NOT DETECTED Final   Norovirus GI/GII NOT DETECTED NOT DETECTED Final   Rotavirus A NOT DETECTED NOT DETECTED Final   Sapovirus (I, II, IV, and V) NOT DETECTED NOT DETECTED Final  C difficile quick scan w PCR  reflex     Status: None   Collection Time: 07/25/16  9:00 PM  Result Value Ref Range Status   C Diff antigen NEGATIVE NEGATIVE Final   C Diff toxin NEGATIVE NEGATIVE Final   C Diff interpretation No C. difficile detected.  Final    RADIOLOGY:  No results found.   Management plans discussed with the patient, family and they are in agreement.  CODE STATUS:     Code Status Orders        Start     Ordered   07/24/16 2336  Full code  Continuous     07/24/16 2335    Code Status History    Date Active Date Inactive Code Status Order ID Comments User Context   This patient has a current code status but no historical code status.      TOTAL TIME TAKING CARE OF THIS PATIENT: 33 minutes.    Thomson Herbers,  Mardi MainlandDavid K M.D on 07/28/2016 at 1:50 PM  Between 7am to 6pm - Pager - [phone]  After 6pm go to [email or web address] - Scientist, research (life [email or web address])password [email or web address]  [facility] [place] [facility]  Office  [phone]  CC: Primary care physician; No PCP Per Patient

## 2016-07-28 NOTE — Op Note (Signed)
[facility] Patient Name: Johnny Grant Procedure Date: 07/28/2016 8:54 AM MRN: [id number] Account #: [id number] Date of Birth: 05/09/1991 Admit Type: Inpatient Age: 89 [facility] Gender: Male Note Status: Finalized Procedure:            Colonoscopy Indications:          Abdominal pain in the right lower quadrant Providers:            Scot Junobert T. Elliott, MD Referring MD:         No Local Md, MD (Referring MD) Medicines:            Propofol per Anesthesia Complications:        No immediate complications. Procedure:            Pre-Anesthesia Assessment:                       - After reviewing the risks and benefits, the patient                        was deemed in satisfactory condition to undergo the                        procedure.                       After obtaining informed consent, the colonoscope was                        passed under direct vision. Throughout the procedure,                        the patient's blood pressure, pulse, and oxygen                        saturations were monitored continuously. The                        Colonoscope was introduced through the anus and                        advanced to the the terminal ileum. The colonoscopy was                        performed without difficulty. The patient tolerated the                        procedure well. The quality of the bowel preparation                        was good. Findings:      The ileum appeared normal. This was entered for 5cm      The entire examined colon appeared normal. Impression:           - The terminal ileum is normal.                       - The entire examined colon is normal.                       - No specimens collected. Recommendation:       - The findings and  recommendations were discussed with                        the referring physician. Consider finish the course of                        antibiotics and discharge  home. Scot Jun, MD 07/28/2016 12:27:06 PM This report has been signed electronically. Number of Addenda: 0 Note Initiated On: 07/28/2016 8:54 AM Scope Withdrawal Time: 0 hours 6 minutes 41 seconds  Total Procedure Duration: 0 hours 14 minutes 57 seconds       [facility]

## 2016-07-29 ENCOUNTER — Encounter: Payer: Self-pay | Admitting: Unknown Physician Specialty

## 2016-07-30 LAB — CULTURE, BLOOD (ROUTINE X 2)
CULTURE: NO GROWTH
CULTURE: NO GROWTH

## 2016-12-09 ENCOUNTER — Emergency Department: Payer: Self-pay

## 2016-12-09 ENCOUNTER — Emergency Department
Admission: EM | Admit: 2016-12-09 | Discharge: 2016-12-09 | Disposition: A | Discharge status: Home / Self Care | Payer: Self-pay | Attending: Emergency Medicine | Admitting: Emergency Medicine

## 2016-12-09 DIAGNOSIS — Z5181 Encounter for therapeutic drug level monitoring: Secondary | ICD-10-CM | POA: Insufficient documentation

## 2016-12-09 DIAGNOSIS — M940 Chondrocostal junction syndrome [Tietze]: Secondary | ICD-10-CM | POA: Insufficient documentation

## 2016-12-09 DIAGNOSIS — F909 Attention-deficit hyperactivity disorder, unspecified type: Secondary | ICD-10-CM | POA: Insufficient documentation

## 2016-12-09 DIAGNOSIS — J40 Bronchitis, not specified as acute or chronic: Secondary | ICD-10-CM | POA: Insufficient documentation

## 2016-12-09 DIAGNOSIS — F17210 Nicotine dependence, cigarettes, uncomplicated: Secondary | ICD-10-CM | POA: Insufficient documentation

## 2016-12-09 HISTORY — DX: Acute pancreatitis without necrosis or infection, unspecified: K85.90

## 2016-12-09 LAB — CBC WITH DIFFERENTIAL/PLATELET
Basophils Absolute: 0 10*3/uL (ref 0–0.1)
Basophils Relative: 0 %
Eosinophils Absolute: 0 10*3/uL (ref 0–0.7)
Eosinophils Relative: 0 %
HCT: 43.1 % (ref 40.0–52.0)
Hemoglobin: 15.4 g/dL (ref 13.0–18.0)
Lymphocytes Relative: 38 %
Lymphs Abs: 2.1 10*3/uL (ref 1.0–3.6)
MCH: 31.7 pg (ref 26.0–34.0)
MCHC: 35.6 g/dL (ref 32.0–36.0)
MCV: 89 fL (ref 80.0–100.0)
Monocytes Absolute: 0.7 10*3/uL (ref 0.2–1.0)
Monocytes Relative: 13 %
Neutro Abs: 2.7 10*3/uL (ref 1.4–6.5)
Neutrophils Relative %: 49 %
Platelets: 197 10*3/uL (ref 150–440)
RBC: 4.85 MIL/uL (ref 4.40–5.90)
RDW: 12 % (ref 11.5–14.5)
WBC: 5.6 10*3/uL (ref 3.8–10.6)

## 2016-12-09 LAB — BASIC METABOLIC PANEL
Anion gap: 7 (ref 5–15)
BUN: 12 mg/dL (ref 6–20)
CO2: 27 mmol/L (ref 22–32)
Calcium: 8.6 mg/dL — ABNORMAL LOW (ref 8.9–10.3)
Chloride: 101 mmol/L (ref 101–111)
Creatinine, Ser: 1.12 mg/dL (ref 0.61–1.24)
GFR calc Af Amer: >60 mL/min (ref >60)
GFR calc non Af Amer: >60 mL/min (ref >60)
Glucose, Bld: 92 mg/dL (ref 65–99)
Potassium: 3.5 mmol/L (ref 3.5–5.1)
Sodium: 135 mmol/L (ref 135–145)

## 2016-12-09 LAB — URINALYSIS, COMPLETE (UACMP) WITH MICROSCOPIC
Bilirubin Urine: NEGATIVE
Glucose, UA: NEGATIVE mg/dL
Hgb urine dipstick: NEGATIVE
Ketones, ur: NEGATIVE mg/dL
Leukocytes, UA: NEGATIVE
Nitrite: NEGATIVE
Protein, ur: NEGATIVE mg/dL
Specific Gravity, Urine: 1.027 (ref 1.005–1.030)
pH: 5 (ref 5.0–8.0)

## 2016-12-09 LAB — URINE DRUG SCREEN, QUALITATIVE (ARMC ONLY)
Amphetamines, Ur Screen: POSITIVE — AB
Barbiturates, Ur Screen: NOT DETECTED
Benzodiazepine, Ur Scrn: NOT DETECTED
Cannabinoid 50 Ng, Ur ~~LOC~~: POSITIVE — AB
Cocaine Metabolite,Ur ~~LOC~~: POSITIVE — AB
MDMA (Ecstasy)Ur Screen: NOT DETECTED
Methadone Scn, Ur: NOT DETECTED
Opiate, Ur Screen: NOT DETECTED
Phencyclidine (PCP) Ur S: NOT DETECTED
Tricyclic, Ur Screen: NOT DETECTED

## 2016-12-09 LAB — TROPONIN I: Troponin I: <0.03 ng/mL (ref <0.03)

## 2016-12-09 LAB — LIPASE, BLOOD: Lipase: 39 U/L (ref 11–51)

## 2016-12-09 MED ORDER — AZITHROMYCIN 250 MG PO TABS: ORAL_TABLET | ORAL | 0 refills | Status: AC

## 2016-12-09 MED ORDER — NAPROXEN 500 MG PO TBEC: 500.0000 mg | DELAYED_RELEASE_TABLET | Freq: Two times a day (BID) | ORAL | 0 refills | Status: AC

## 2016-12-09 MED ORDER — IPRATROPIUM-ALBUTEROL 0.5-2.5 (3) MG/3ML IN SOLN
3.0000 mL | Freq: Once | RESPIRATORY_TRACT | Status: AC
  Administered 2016-12-09: 3 mL via RESPIRATORY_TRACT
  Filled 2016-12-09: qty 3

## 2016-12-09 NOTE — ED Provider Notes (Signed)
ED ECG REPORT I, Arelia LongestSchaevitz,  Nandika Stetzer M, the attending physician, personally viewed and interpreted this ECG.   Date: 12/09/2016  EKG Time: 1608  Rate: 74  Rhythm: normal sinus rhythm  Axis: rightward axis  Intervals:none  ST&T Change: Diffuse, concave ST elevation consistent with early repolarization versus pericarditis versus injury. The EKG appears unchanged from previous which also show early repolarization. T-wave inversion in V2. Possibly related to lead placement.    Myrna Blazeravid Matthew Markus Casten, MD 12/09/16 [phone]

## 2016-12-09 NOTE — ED Notes (Signed)
See triage note  Presents with "being sick for 2 weeks"   States he has had n/v   Last time for diarrhea or vomiting was 1-2 days ago  Unsure of fever  But afebrile on arrival   Also states he has had some intermittent stomach pain after eating  Also cough which is occasional prod

## 2016-12-09 NOTE — ED Notes (Signed)
Lab called about pts UA, estimated results w/i 10 minutes

## 2016-12-09 NOTE — ED Triage Notes (Signed)
Pt c/o cough with congestion for the past 2 weeks and is now having rib pain with cough and deep breathing.

## 2016-12-09 NOTE — ED Provider Notes (Signed)
[facility] Emergency Department Provider Note  ____________________________________________  Time seen: Approximately 3:59 PM  I have reviewed the triage vital signs and the nursing notes.   HISTORY  Chief Complaint Cough    HPI Johnny Grant is a 26 y.o. male with a history of pancreatitis presents to the emergency department with productive cough for the past 2 weeks. Patient states that he has had purulent sputum production. He is unsure of fever. He has not assessed his temperature. Patient has had fatigue but no shortness of breath. Patient also states that he has mild epigastric abdominal pain that radiates to the back. Patient states that radiating back pain has occurred since onset of cough. Patient states that he had vomiting and diarrhea 1-2 days ago. Patient also states has reproducible pain with palpation along the costochondral joints that is worsened with deep inspiration. Patient has been taking Tylenol. No other alleviating measures haven't been attempted. Patient denies headache and rhinorrhea. He has been congested. Patient denies dysuria and increased urinary frequency, hematuria, hematochezia and hemoptysis.   Past Medical History:  Diagnosis Date  . ADD (attention deficit disorder)   . Pancreatitis   . Patient denies medical problems     Patient Active Problem List   Diagnosis Date Noted  . Intractable abdominal pain 07/24/2016  . Nausea vomiting and diarrhea 07/24/2016    Past Surgical History:  Procedure Laterality Date  . COLONOSCOPY WITH PROPOFOL N/A 07/28/2016   Procedure: COLONOSCOPY WITH PROPOFOL;  Surgeon: Scot Jun, MD;  Location: [facility] ENDOSCOPY;  Service: Endoscopy;  Laterality: N/A;  . NO PAST SURGERIES      Prior to Admission medications   Medication Sig Start Date End Date Taking? Authorizing Provider  azithromycin (ZITHROMAX Z-PAK) 250 MG tablet Take 2 tablets (500 mg) on  Day 1,  followed by 1 tablet (250 mg)  once daily on Days 2 through 5. 12/09/16 12/14/16  Orvil Feil, PA-C  naproxen (EC NAPROSYN) 500 MG EC tablet Take 1 tablet (500 mg total) by mouth 2 (two) times daily with a meal. 12/09/16 12/19/16  Orvil Feil, PA-C  oxyCODONE (OXY IR/ROXICODONE) 5 MG immediate release tablet Take 1 tablet (5 mg total) by mouth every 4 (four) hours as needed for moderate pain. 07/28/16   Wyatt Haste, MD    Allergies Patient has no known allergies.  Family History  Problem Relation Age of Onset  . Osteoporosis Maternal Grandmother     Social History Social History  Substance Use Topics  . Smoking status: Current Every Day Smoker    Types: Cigarettes  . Smokeless tobacco: Never Used  . Alcohol use No     Review of Systems  Constitutional: No fever/chills Eyes: No visual changes. No discharge ENT: Patient has had congestion  Cardiovascular: Patient has reproducible anterior chest wall pain. Respiratory: Patient has productive cough No SOB. Gastrointestinal: Patient has epigastric abdominal pain with nausea, vomiting and diarrhea. Genitourinary: Negative for dysuria. No hematuria Skin: Negative for rash, abrasions, lacerations, ecchymosis. Neurological: Negative for headaches, focal weakness or numbness. ____________________________________________   PHYSICAL EXAM:  VITAL SIGNS: ED Triage Vitals  Enc Vitals Group     BP 12/09/16 1529 (!) 129/109     Pulse Rate 12/09/16 1529 80     Resp 12/09/16 1529 16     Temp 12/09/16 1529 98.4 F (36.9 C)     Temp Source 12/09/16 1529 Oral     SpO2 12/09/16 1529 96 %  Weight 12/09/16 1530 150 lb (68 kg)     Height 12/09/16 1530 5\' 6"  (1.676 m)     Head Circumference --      Peak Flow --      Pain Score 12/09/16 1534 8     Pain Loc --      Pain Edu? --      Excl. in GC? --      Constitutional: Alert and oriented. Patient is talkative and engaged.  Eyes: Palpebral and bulbar conjunctiva are nonerythematous bilaterally. PERRL. EOMI. No  scleral icterus bilaterally. Head: Atraumatic. ENT:      Ears: Tympanic membranes are pearly bilaterally without effusion, erythema or purulent exudate. Bony landmarks are visualized bilaterally.       Nose: Skin overlying nares is without erythema. Nasal turbinates are non-erythematous. Nasal septum is midline.      Mouth/Throat: Mucous membranes are moist. Posterior pharynx is mildly erythematous. No tonsillar exudate, hypertrophy or petechiae visualized. Uvula is midline. Neck: Full range of motion. No pain with neck flexion. Hematological/Lymphatic/Immunilogical: No cervical lymphadenopathy.  Cardiovascular: No scars of the skin overlying the anterior or posterior chest wall. Patient has pain with palpation along the sternocostal joints. Normal rate, regular rhythm. No pericardial friction rub auscultated. Normal S1 and S2. No murmurs, gallops or rubs auscultated.  Respiratory: Trachea is midline. No retractions or presence of deformity. Thoracic expansion is symmetric with unaccentuated tactile fremitus. Resonant and symmetric percussion tones bilaterally. On auscultation, wheezing is auscultated at the lung bases bilaterally. Wheezing improved to auscultation after DuoNeb treatment. Gastrointestinal No areas of visible pulsations or peristalsis. Active bowel sounds audible in all four quadrants. No friction rubs over liver or spleen auscultated. Percussion tones tympanic over epigastrium and resonant over remainder of abdomen. On inspiration, liver edge is firm, smooth and non-tender. No splenomegaly. Musculature soft and relaxed to light palpation. No masses or areas of tenderness to deep palpation. No costovertebral angle tenderness bilaterally.  Neurologic:  Normal for age. No gross focal neurologic deficits are appreciated.  Skin:  Skin is warm, dry and intact. No rash noted. No clubbing or cyanosis of the digits visualized.  ____________________________________________   LABS (all labs  ordered are listed, but only abnormal results are displayed)  Labs Reviewed  URINE DRUG SCREEN, QUALITATIVE ([facility] ONLY) - Abnormal; Notable for the following:       Result Value   Amphetamines, Ur Screen POSITIVE (*)    Cocaine Metabolite,Ur [place] POSITIVE (*)    Cannabinoid 50 Ng, Ur  POSITIVE (*)    All other components within normal limits  BASIC METABOLIC PANEL - Abnormal; Notable for the following:    Calcium 8.6 (*)    All other components within normal limits  LIPASE, BLOOD  CBC WITH DIFFERENTIAL/PLATELET  TROPONIN I  URINALYSIS, COMPLETE (UACMP) WITH MICROSCOPIC   ____________________________________________  EKG   ____________________________________________  RADIOLOGY Geraldo Pitter, personally viewed and evaluated these images (plain radiographs) as part of my medical decision making, as well as reviewing the written report by the radiologist.    Dg Chest 2 View  Result Date: 12/09/2016 CLINICAL DATA:  26 y/o  M; 2 weeks of cough. EXAM: CHEST  2 VIEW COMPARISON:  02/24/2016 chest radiograph FINDINGS: Stable heart size and mediastinal contours are within normal limits. Both lungs are clear. The visualized skeletal structures are unremarkable. IMPRESSION: No active cardiopulmonary disease. Electronically Signed   By: Mitzi Hansen M.D.   On: 12/09/2016 16:25    ____________________________________________  PROCEDURES  Procedure(s) performed:    Procedures    Medications  ipratropium-albuterol (DUONEB) 0.5-2.5 (3) MG/3ML nebulizer solution 3 mL (3 mLs Nebulization Given 12/09/16 1647)     ____________________________________________   INITIAL IMPRESSION / ASSESSMENT AND PLAN / ED COURSE  Pertinent labs & imaging results that were available during my care of the patient were reviewed by me and considered in my medical decision making (see chart for details).  Review of the [place] CSRS was performed in accordance of the [facility] prior to  dispensing any controlled drugs.     Assessment and Plan:  Acute Bronchitis Costochondritis  Patient presents to the emergency department with productive cough for the past 2 weeks. Patient also had epigastric abdominal pain radiating to the back and reproducible sternocostal pain. EKG conducted in the emergency department is similar to past EKGs with normal sinus rhythm. Troponin 1 was within reference range. EKG was reviewed by myself and my supervising physician, Dr. Loreli Dollaravid Shaevitz. CBC, CMP, lipase and BMP are reassuring at this time. DG chest reveals no consolidations or findings consistent with pneumonia. Urine drug screen was positive for amphetamines, cocaine and cannabinoids. [facility]. Patient education was provided regarding substance abuse. Patient was discharged with azithromycin and naproxen for acute bronchitis and costochondritis. Vital signs are reassuring at this time aside from hypertension. All patient questions were answered.  ____________________________________________  FINAL CLINICAL IMPRESSION(S) / ED DIAGNOSES  Final diagnoses:  Bronchitis  Costochondritis      NEW MEDICATIONS STARTED DURING THIS VISIT:  New Prescriptions   AZITHROMYCIN (ZITHROMAX Z-PAK) 250 MG TABLET    Take 2 tablets (500 mg) on  Day 1,  followed by 1 tablet (250 mg) once daily on Days 2 through 5.   NAPROXEN (EC NAPROSYN) 500 MG EC TABLET    Take 1 tablet (500 mg total) by mouth 2 (two) times daily with a meal.        This chart was dictated using voice recognition software/Dragon. Despite best efforts to proofread, errors can occur which can change the meaning. Any change was purely unintentional.    Orvil FeilJaclyn M Dayle Sherpa, PA-C 12/10/16 0001    Myrna Blazeravid Matthew Schaevitz, MD 12/10/16 [phone]

## 2017-08-30 IMAGING — CT CT ABD-PELV W/ CM
2 of 4 series · 15 of 46 positions shown, 17 images · IV contrast (APPLIED)
Comparison: None.

CLINICAL DATA: Right upper quadrant and right lower quadrant pain
for 3 days. Vomiting, diarrhea. Blood in stool.

EXAM:
CT ABDOMEN AND PELVIS WITH CONTRAST
TECHNIQUE: Multidetector CT imaging of the abdomen and pelvis was performed
using the standard protocol following bolus administration of
intravenous contrast.
CONTRAST:  100mL [id number] IOPAMIDOL ([id number]) INJECTION 61%

[Series 2: axial st · axial · 0.67mm/px · z∈[-943,-518]mm · 12 of 97 slices shown, 14 images]
[im 8/97  soft-tissue]
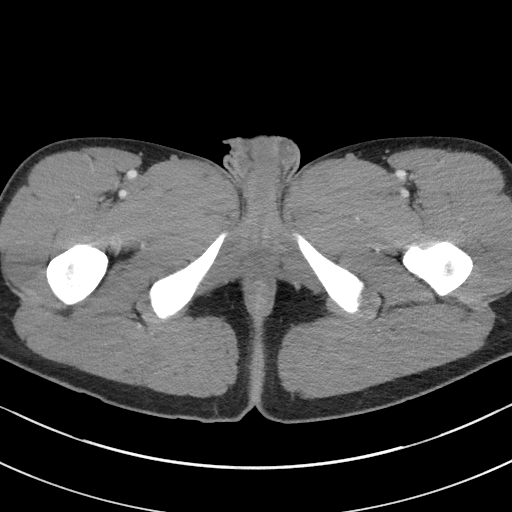
[im 8/97  bone]
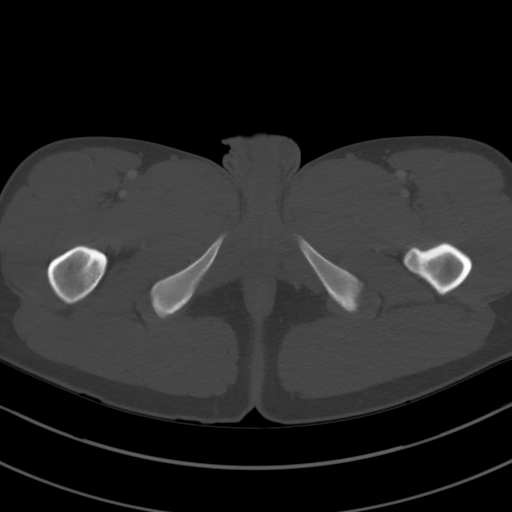
[im 16/97  soft-tissue]
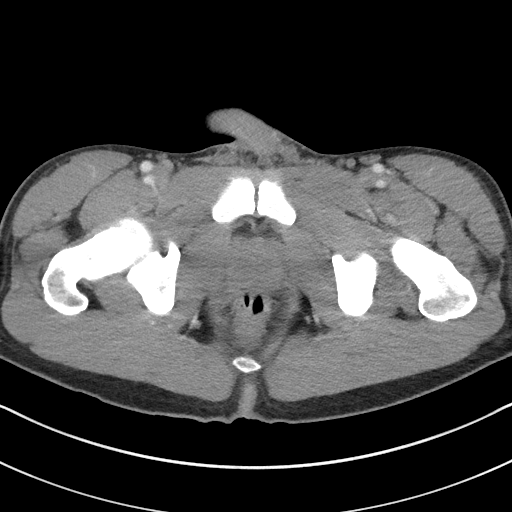
[im 24/97  soft-tissue]
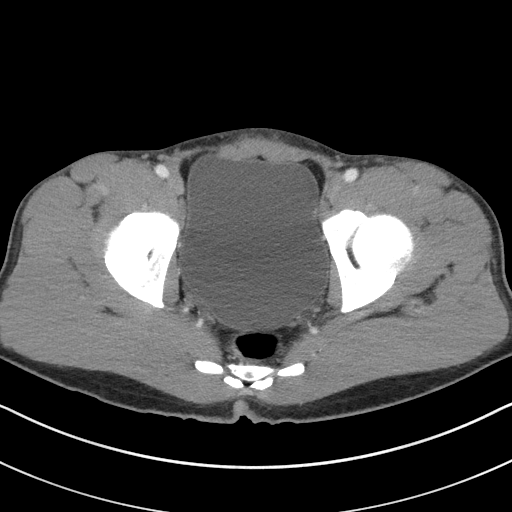
[im 31/97  soft-tissue]
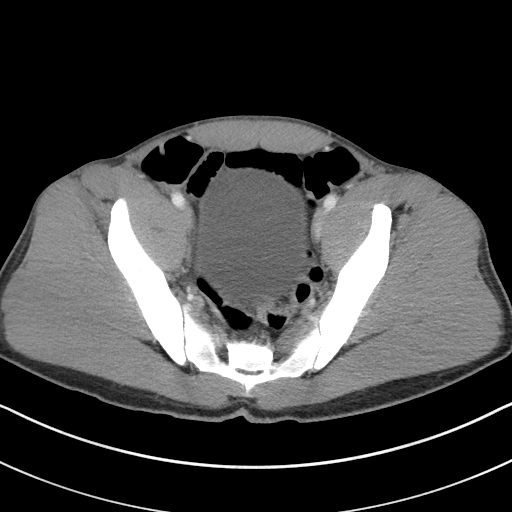
[im 39/97  soft-tissue]
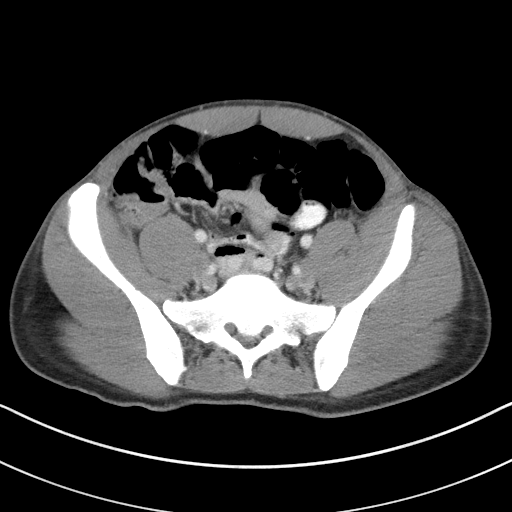
[im 47/97  soft-tissue]
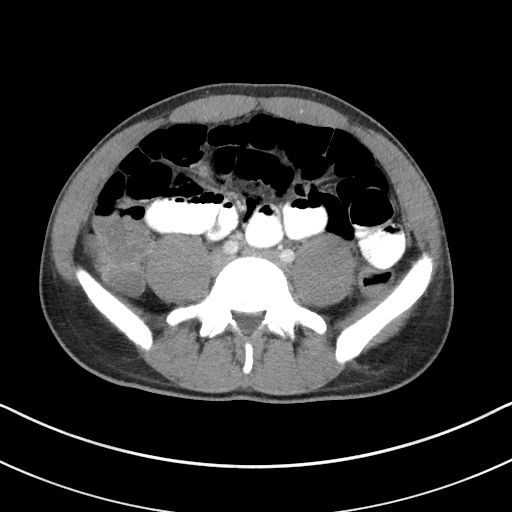
[im 54/97  soft-tissue]
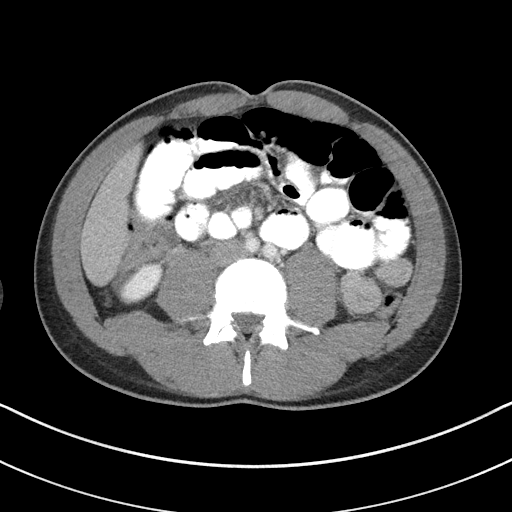
[im 62/97  soft-tissue]
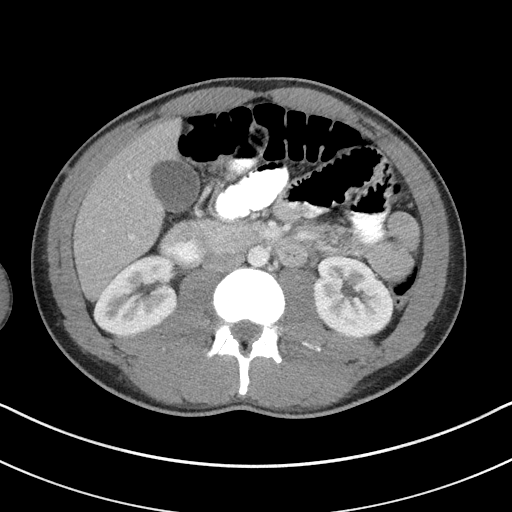
[im 70/97  soft-tissue]
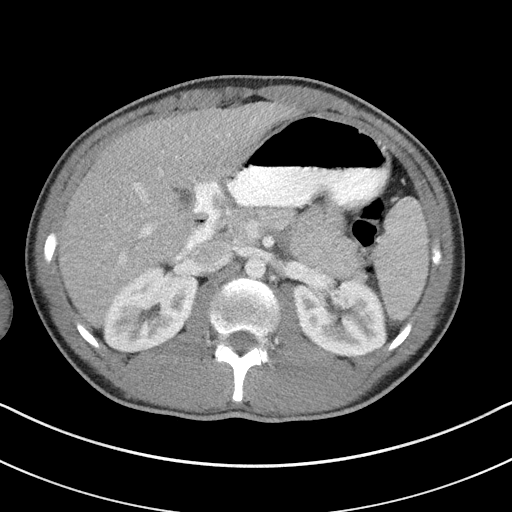
[im 70/97  bone]
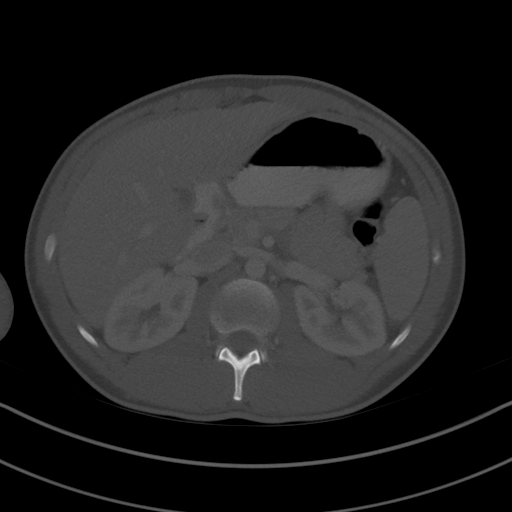
[im 77/97  soft-tissue]
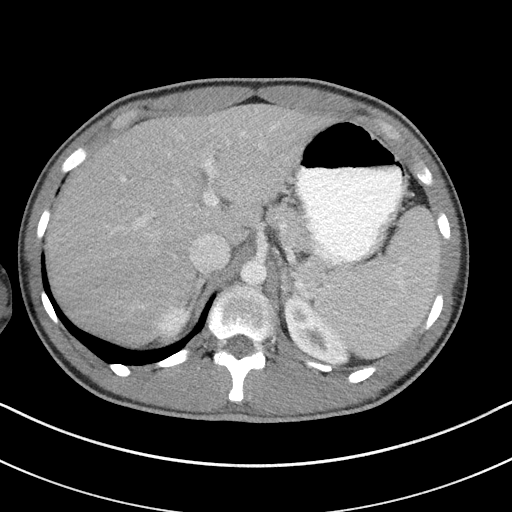
[im 85/97  soft-tissue]
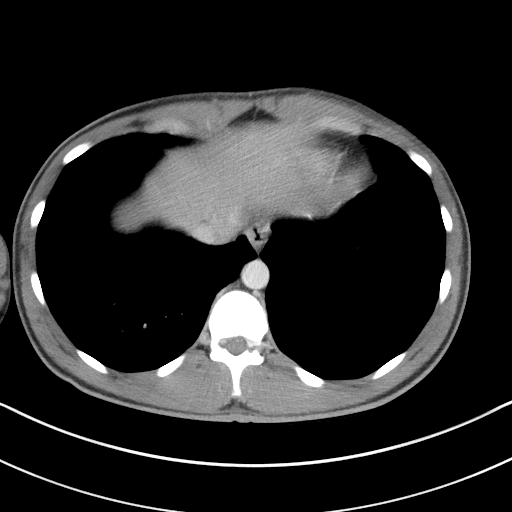
[im 93/97  soft-tissue]
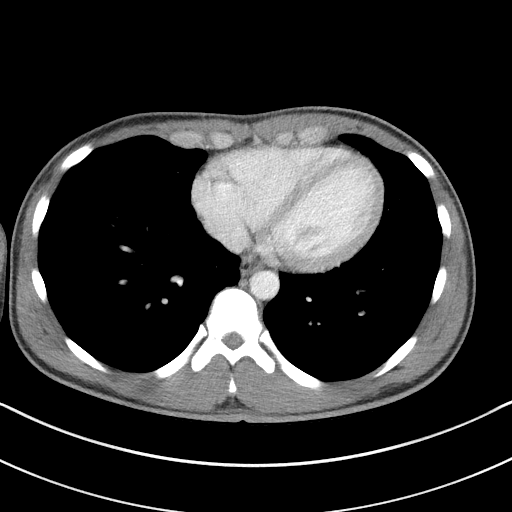

[Series 5: coronal st · coronal · 0.60mm/px · 3 of 71 slices shown]
[im 24/71  soft-tissue]
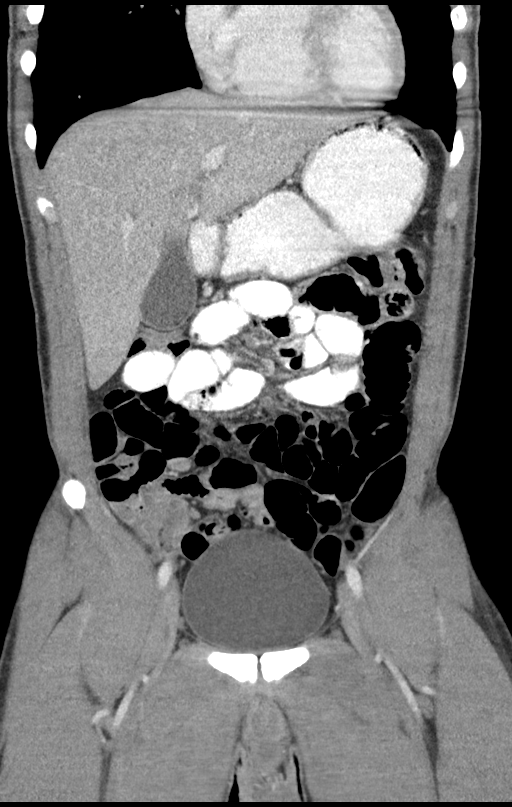
[im 32/71  soft-tissue]
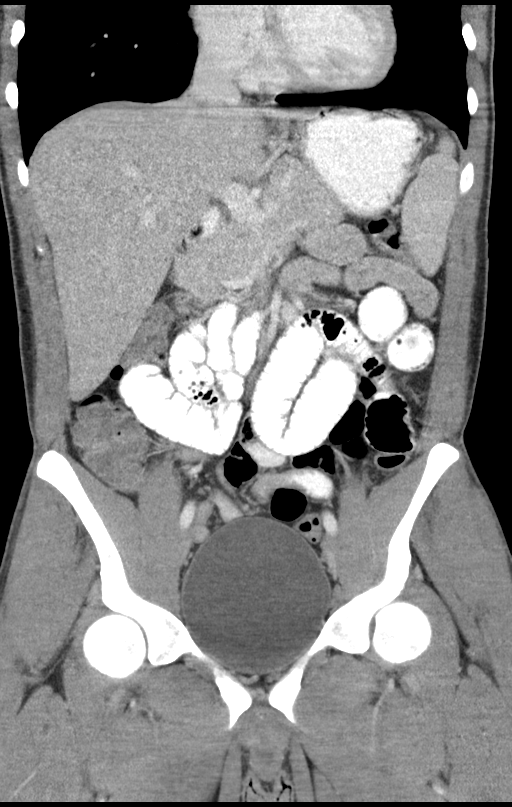
[im 39/71  soft-tissue]
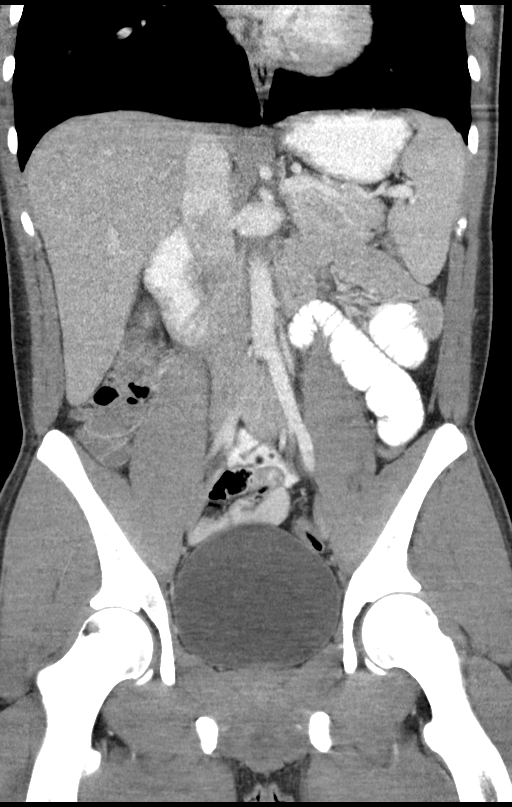

[15 of 46 positions shown; findings below may reference images not displayed]

FINDINGS: Lower chest: No pulmonary nodules, pleural effusions, or
infiltrates. Heart size is normal. No imaged pericardial effusion or
significant coronary artery calcifications.

Hepatobiliary: Gallbladder has a normal appearance.

Pancreas: Unremarkable. No pancreatic ductal dilatation or
surrounding inflammatory changes.

Spleen: Normal in size without focal abnormality.

Adrenals/Urinary Tract: Adrenal glands are normal in appearance.
Kidneys are symmetric in enhancement and size. No hydronephrosis or
focal renal mass.

Stomach/Bowel: The stomach is normal in appearance. There is mild
dilatation of small bowel loops. Contrast does not reach the distal
small bowel loops at the time of the exam. Distal small bowel loops
are normal in caliber. Terminal ileum has a normal appearance.
Colonic loops are normal in appearance are unopacified. Appendix is
not seen.

Vascular/Lymphatic: No evidence for aortic aneurysm. No
retroperitoneal or mesenteric adenopathy.

Reproductive: Urinary bladder, prostate, and seminal vesicles are
normal in appearance. No free pelvic fluid.

Other: None

Musculoskeletal: Visualized osseous structures have a normal
appearance.
IMPRESSION: 1. Mild dilatation of proximal small bowel loops. Distal small bowel
loops are relatively normal in caliber. Findings raise a question of
early or partial small bowel obstruction. A definite transition
point is not identified.
2. Appendix is not well seen.
3. No abscess or free air.

## 2017-10-31 IMAGING — US US RENAL
1 series · 14 of 25 positions shown · non-contrast
Comparison: None.

CLINICAL DATA: Urinary tract infection.  Fever and weakness.

EXAM:
RENAL / URINARY TRACT ULTRASOUND COMPLETE

[Series 1: us renal · 0.23mm/px · 14 of 33 slices shown]
[im 1/33]
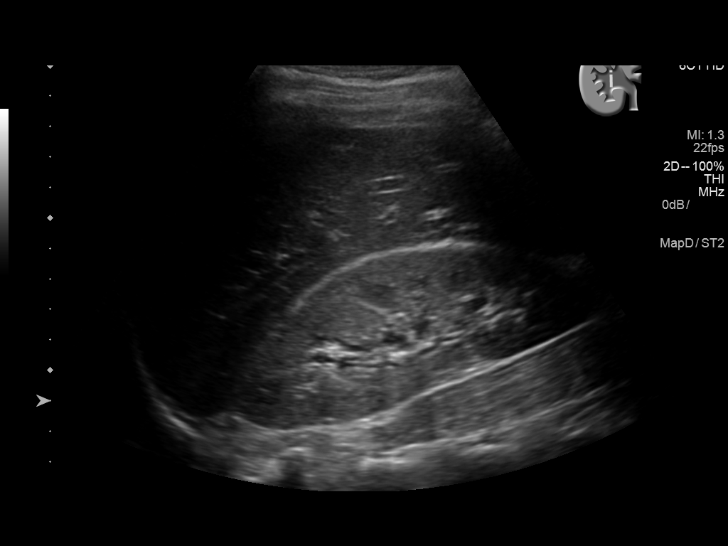
[im 3/33]
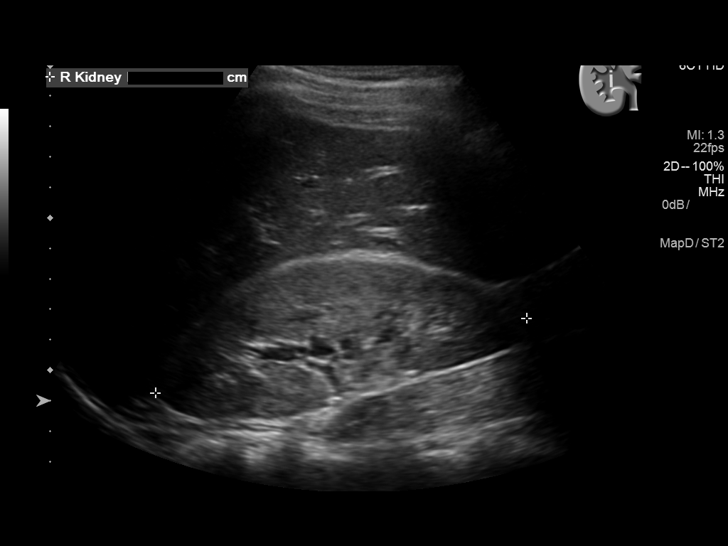
[im 6/33]
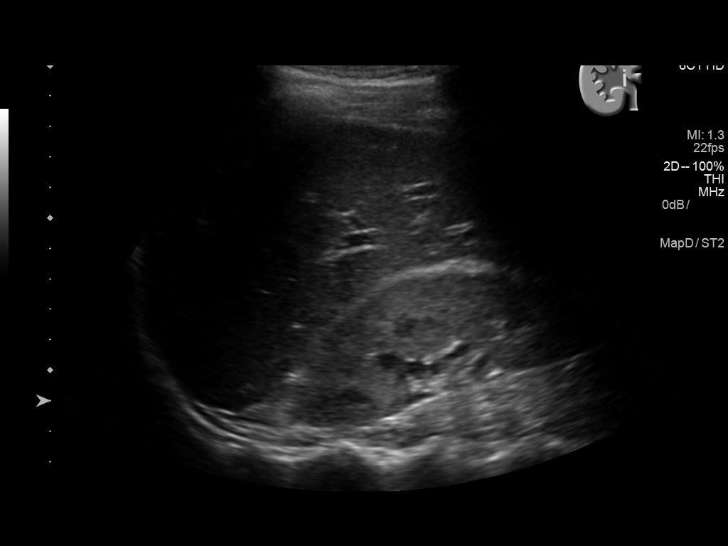
[im 9/33]
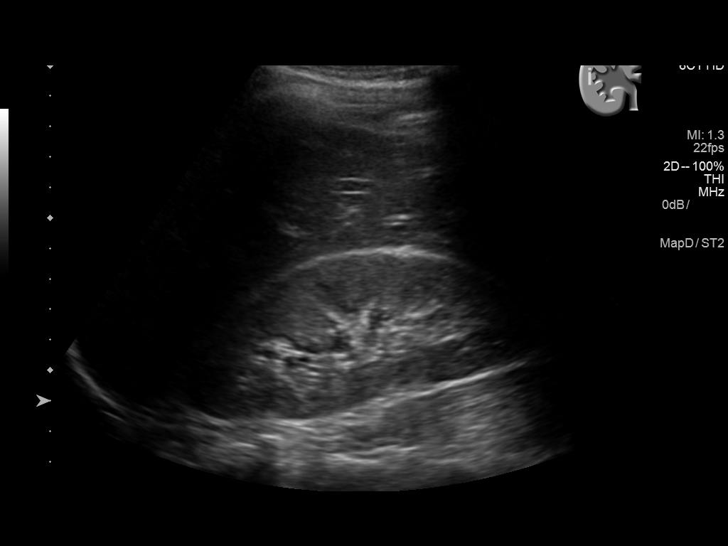
[im 11/33]
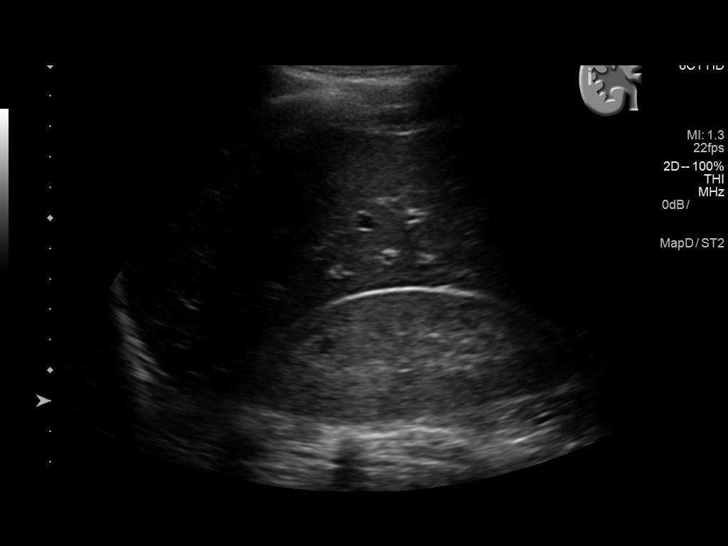
[im 13/33]
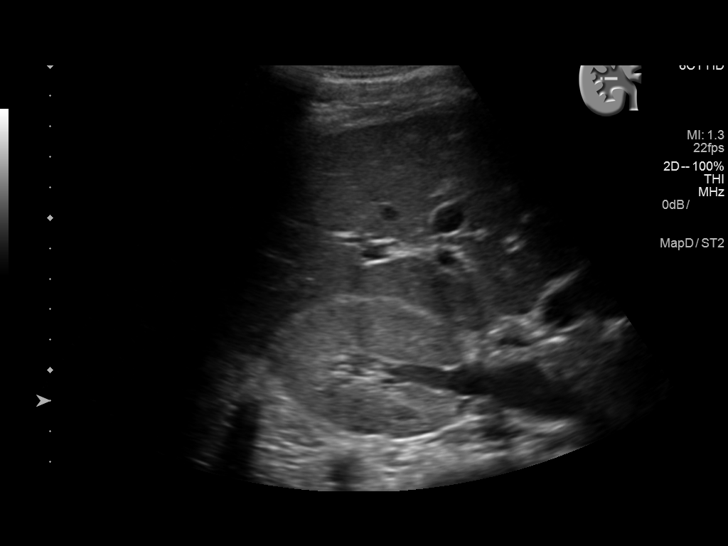
[im 15/33]
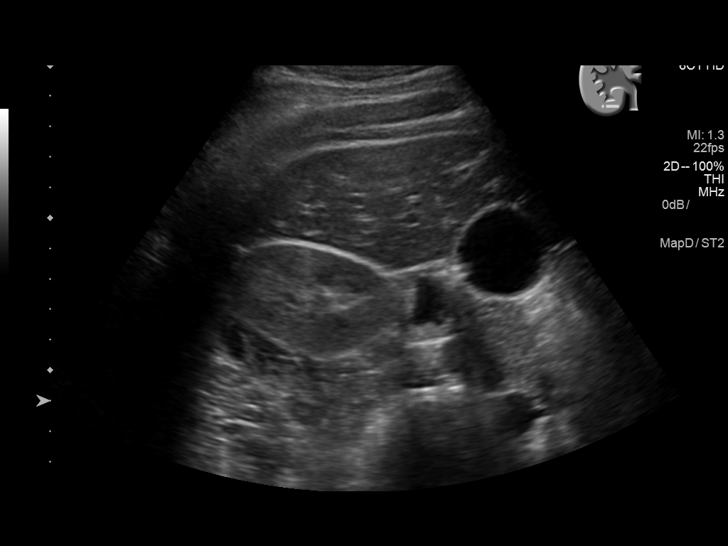
[im 18/33]
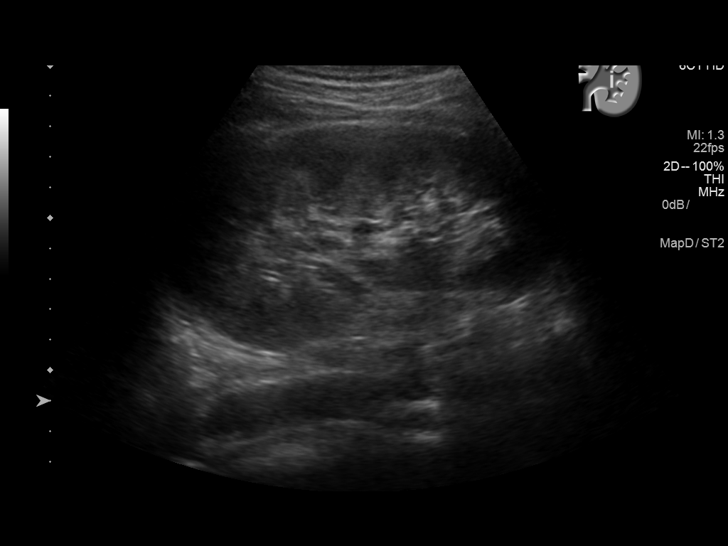
[im 21/33]
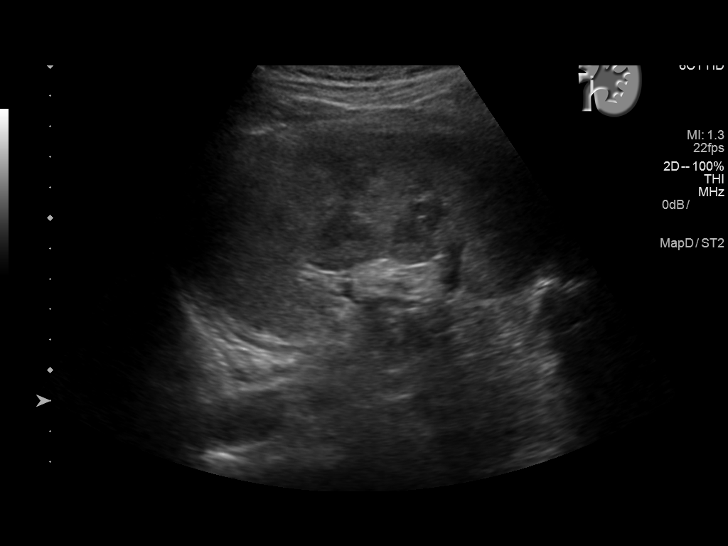
[im 22/33]
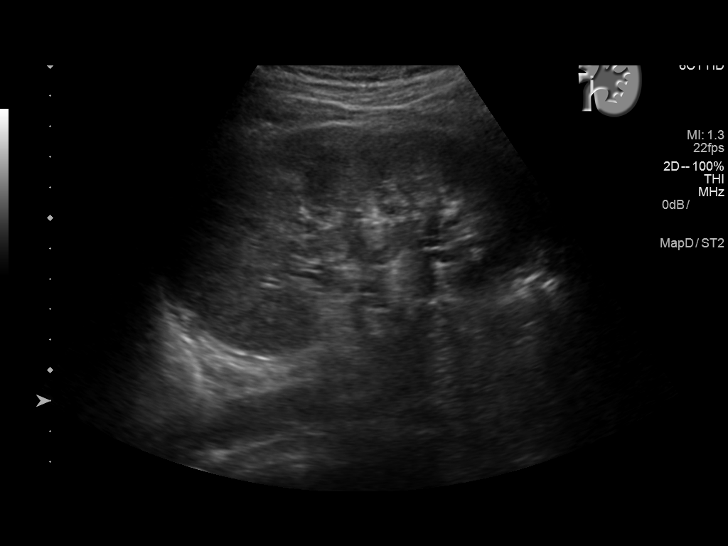
[im 25/33]
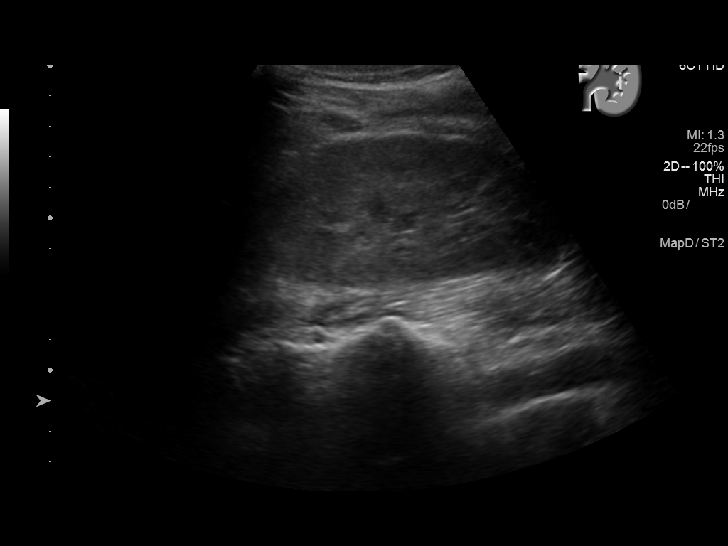
[im 27/33]
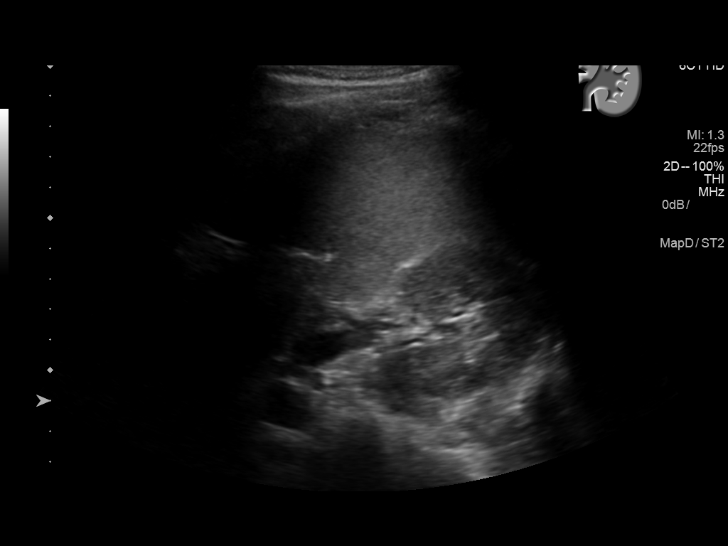
[im 30/33]
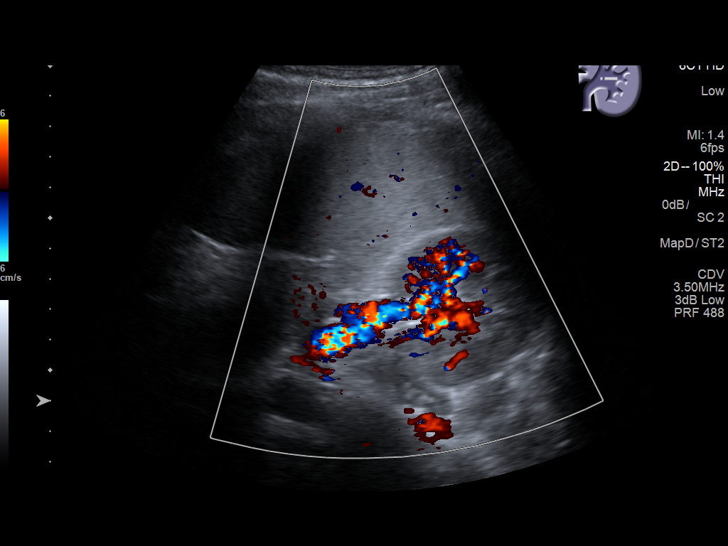
[im 33/33]
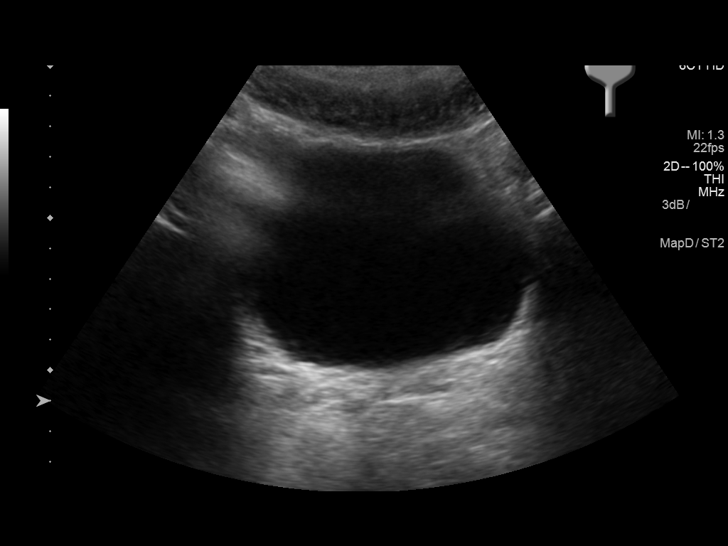

[14 of 25 positions shown; findings below may reference images not displayed]

FINDINGS: Right Kidney:

Length: 12.4 cm. Increased parenchymal echogenicity. No mass or
hydronephrosis visualized.

Left Kidney:

Length: 13.1 cm. Increased parenchymal echogenicity. No mass or
hydronephrosis visualized.

Bladder:

Appears normal for degree of bladder distention.
IMPRESSION: 1. No evidence for mass or hydronephrosis.
2. Bilateral echogenic kidneys which may be seen with chronic
medical renal disease.

## 2017-11-01 IMAGING — US US ABDOMEN COMPLETE
1 series · 14 of 25 positions shown · non-contrast
Comparison: CT scan July 24, 2016

CLINICAL DATA: Abdominal pain for 2 weeks.

EXAM:
ABDOMEN ULTRASOUND COMPLETE

[Series 1: us abdomen complete · 0.20mm/px · 14 of 129 slices shown]
[im 1/129]
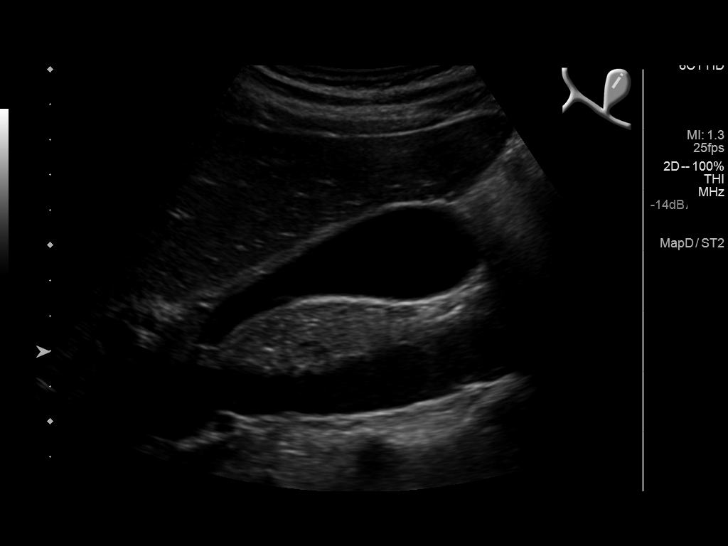
[im 11/129]
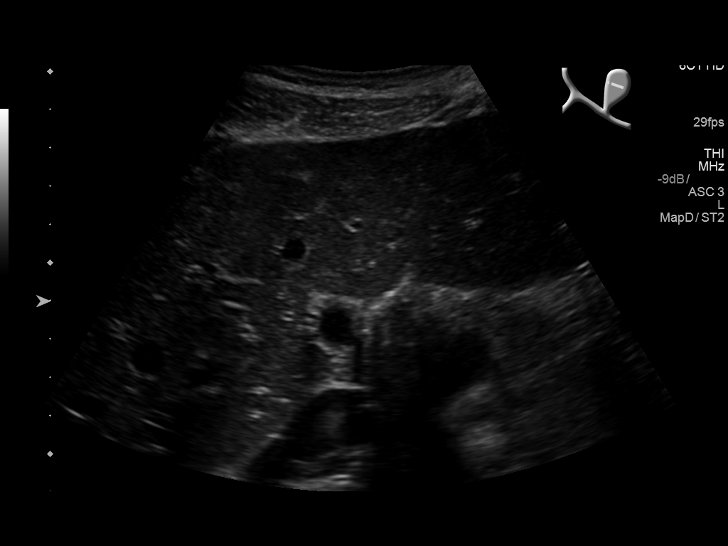
[im 22/129]
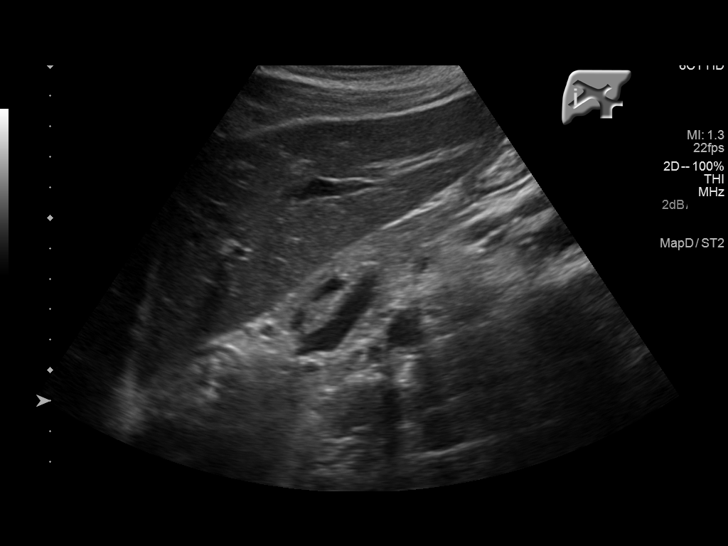
[im 33/129]
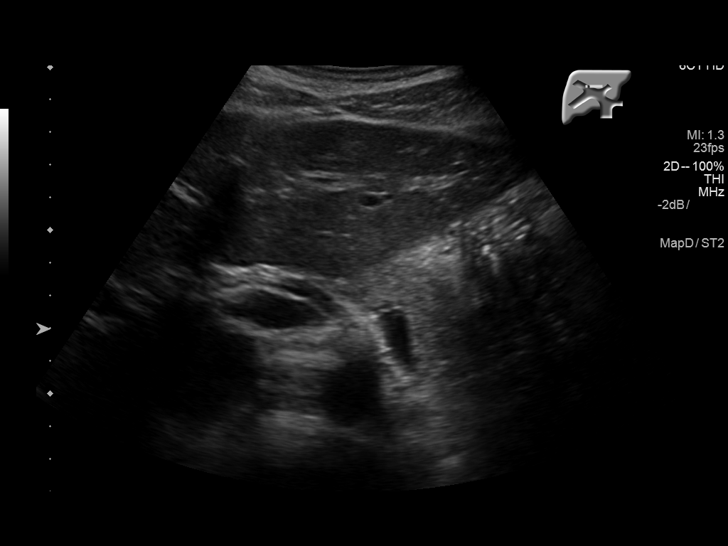
[im 43/129]
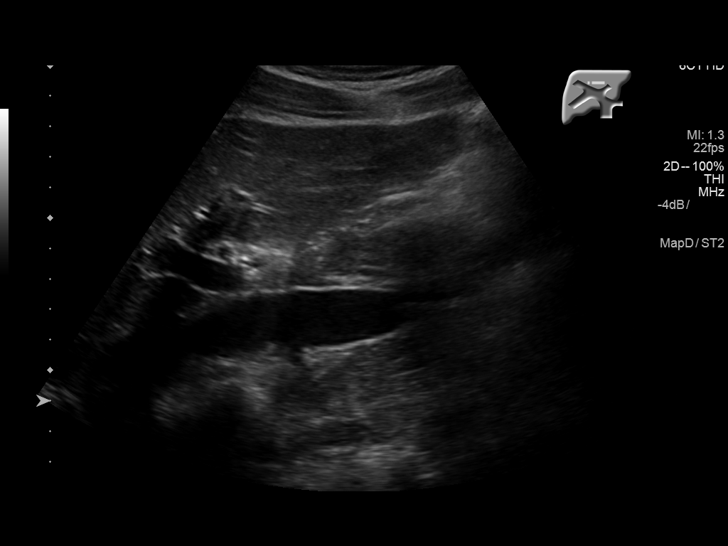
[im 49/129]
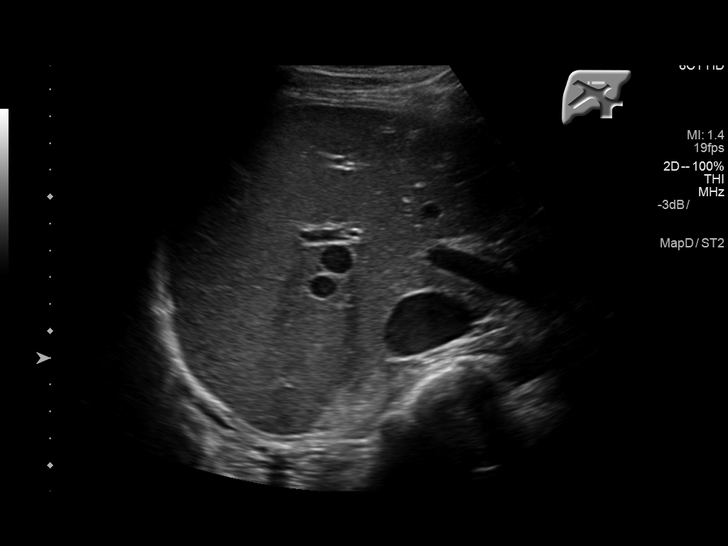
[im 59/129]
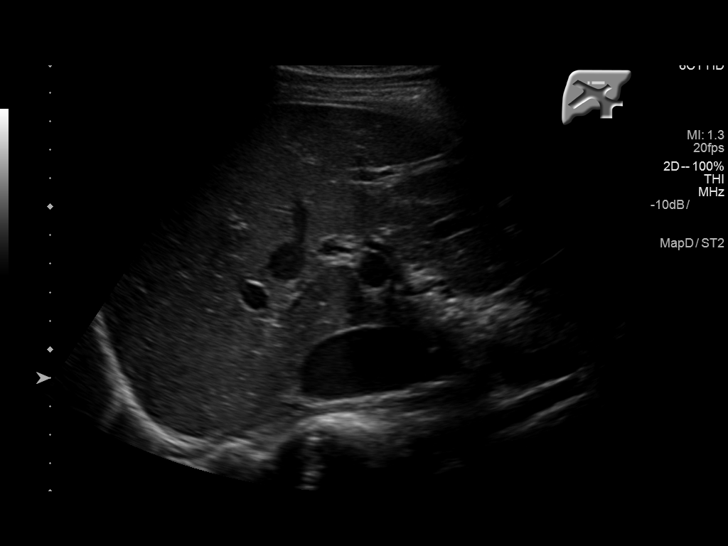
[im 70/129]
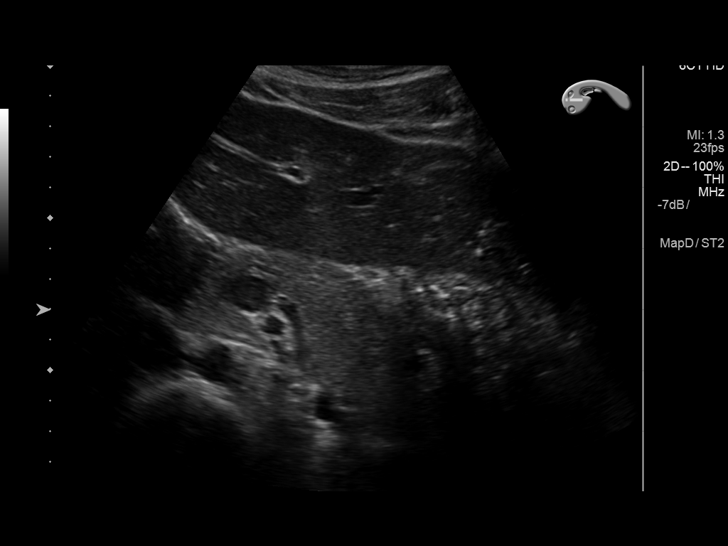
[im 81/129]
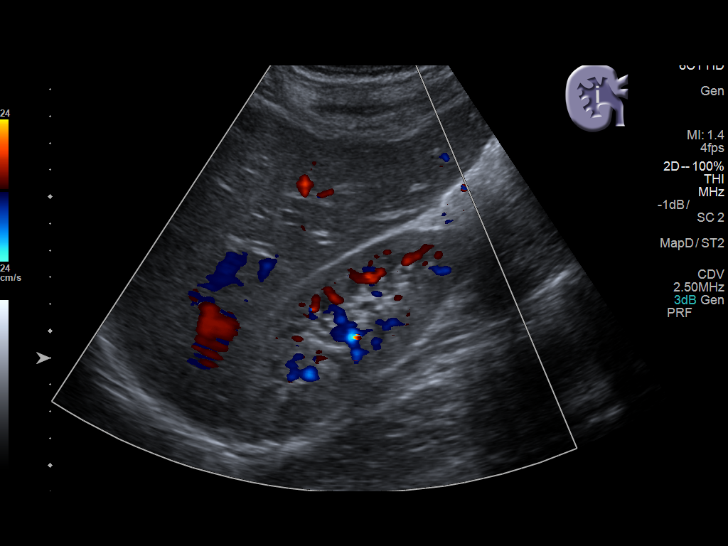
[im 86/129]
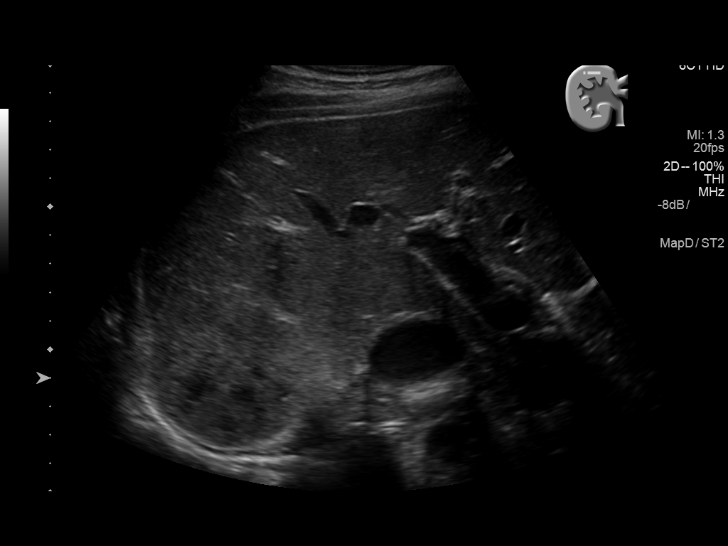
[im 97/129]
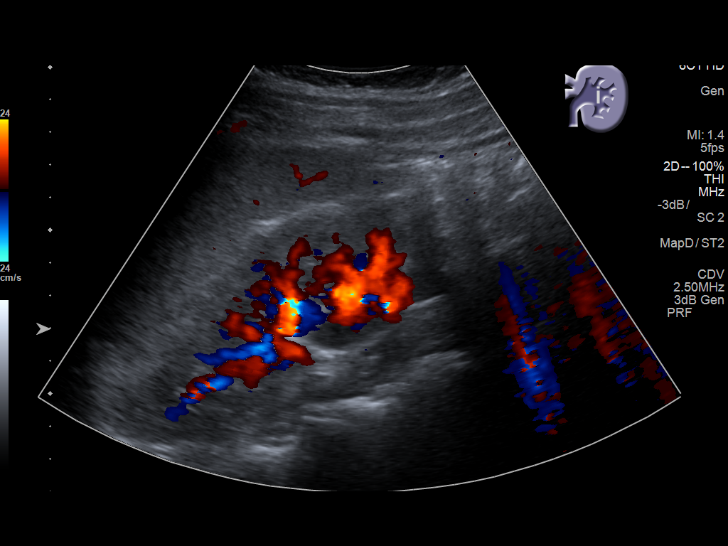
[im 107/129]
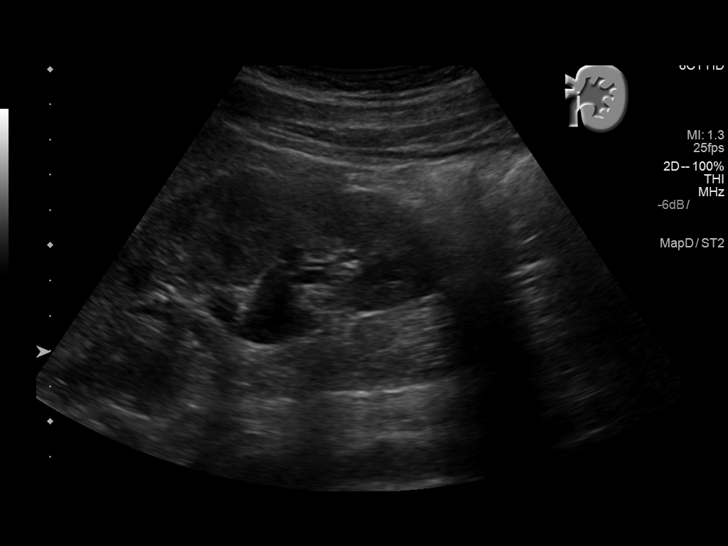
[im 118/129]
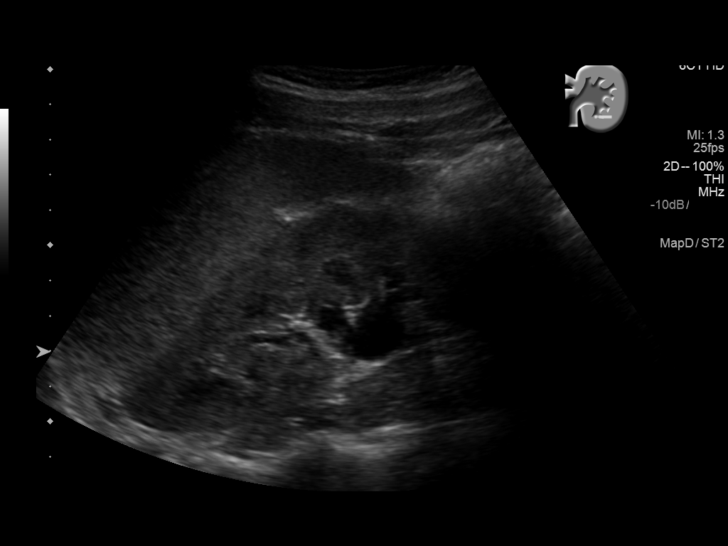
[im 129/129]
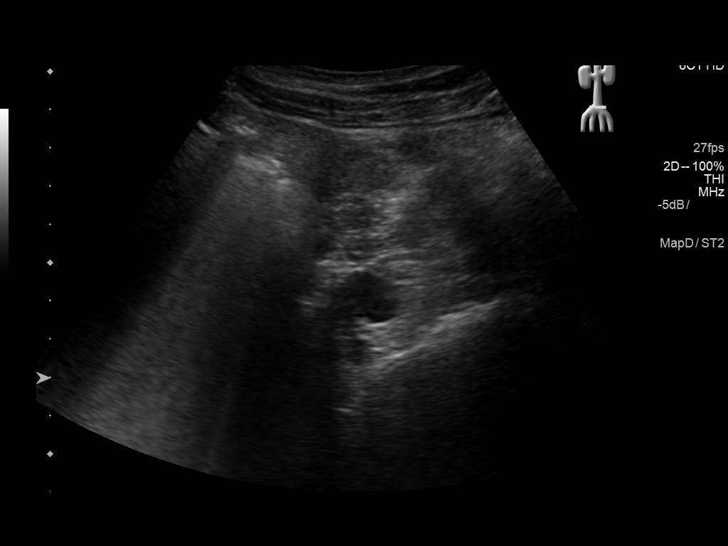

[14 of 25 positions shown; findings below may reference images not displayed]

FINDINGS: Gallbladder: No gallstones or wall thickening visualized. No
sonographic Murphy sign noted by sonographer.

Common bile duct: Diameter: 3.7 mm

Liver: No focal lesion identified. Within normal limits in
parenchymal echogenicity.

IVC: No abnormality visualized.

Pancreas: Visualized portion unremarkable.

Spleen: Size and appearance within normal limits.

Right Kidney: Length: 13 cm. Increased cortical echogenicity,
unchanged since yesterday's ultrasound.

Left Kidney: Length: 13.6 cm. Increased cortical echogenicity. No
significant hydronephrosis.

Abdominal aorta: No aneurysm visualized.

Other findings: None.
IMPRESSION: 1. No acute abnormalities to explain the patient's symptoms.

## 2019-10-18 ENCOUNTER — Other Ambulatory Visit: Payer: Self-pay

## 2019-10-18 ENCOUNTER — Ambulatory Visit: Payer: HRSA Program | Attending: Internal Medicine

## 2019-10-18 DIAGNOSIS — Z20828 Contact with and (suspected) exposure to other viral communicable diseases: Secondary | ICD-10-CM | POA: Diagnosis not present

## 2019-10-18 DIAGNOSIS — Z20822 Contact with and (suspected) exposure to covid-19: Secondary | ICD-10-CM

## 2019-10-20 ENCOUNTER — Ambulatory Visit: Payer: Self-pay | Admitting: *Deleted

## 2019-10-20 ENCOUNTER — Telehealth: Payer: Self-pay | Admitting: General Practice

## 2019-10-20 LAB — NOVEL CORONAVIRUS, NAA: SARS-CoV-2, NAA: NOT DETECTED

## 2019-10-20 NOTE — Telephone Encounter (Signed)
Pt stated he got tested and he was negative. Lives with his aunt who is positive He wanted to know what to do. Advised that he should try to stay quarantine from his aunt and when they are in the same room, make sure he is wearing a mask. He stated that he is also having symptoms and advise to treat those symptoms with over the counter medications. Explained to him that some people will have symptoms and not be positive and some people posit vie and not have any symptoms. He voiced understanding and advised to call back for any questions

## 2019-10-20 NOTE — Telephone Encounter (Signed)
Patient is calling is receive his negative COVID test results. Patient expressed understanding.

## 2019-11-01 ENCOUNTER — Ambulatory Visit: Payer: Self-pay | Attending: Internal Medicine

## 2019-11-01 DIAGNOSIS — Z20822 Contact with and (suspected) exposure to covid-19: Secondary | ICD-10-CM | POA: Insufficient documentation

## 2019-11-03 LAB — NOVEL CORONAVIRUS, NAA: SARS-CoV-2, NAA: NOT DETECTED

## 2020-02-23 ENCOUNTER — Emergency Department
Admission: EM | Admit: 2020-02-23 | Discharge: 2020-02-23 | Disposition: A | Discharge status: Home / Self Care | Payer: Self-pay | Attending: Student in an Organized Health Care Education/Training Program | Admitting: Student in an Organized Health Care Education/Training Program

## 2020-02-23 ENCOUNTER — Emergency Department: Payer: Self-pay

## 2020-02-23 ENCOUNTER — Encounter: Payer: Self-pay | Admitting: Emergency Medicine

## 2020-02-23 ENCOUNTER — Other Ambulatory Visit: Payer: Self-pay

## 2020-02-23 DIAGNOSIS — F17210 Nicotine dependence, cigarettes, uncomplicated: Secondary | ICD-10-CM | POA: Insufficient documentation

## 2020-02-23 DIAGNOSIS — Y990 Civilian activity done for income or pay: Secondary | ICD-10-CM | POA: Insufficient documentation

## 2020-02-23 DIAGNOSIS — X509XXA Other and unspecified overexertion or strenuous movements or postures, initial encounter: Secondary | ICD-10-CM | POA: Insufficient documentation

## 2020-02-23 DIAGNOSIS — Y929 Unspecified place or not applicable: Secondary | ICD-10-CM | POA: Insufficient documentation

## 2020-02-23 DIAGNOSIS — Y9389 Activity, other specified: Secondary | ICD-10-CM | POA: Insufficient documentation

## 2020-02-23 DIAGNOSIS — S46911A Strain of unspecified muscle, fascia and tendon at shoulder and upper arm level, right arm, initial encounter: Secondary | ICD-10-CM | POA: Insufficient documentation

## 2020-02-23 MED ORDER — CYCLOBENZAPRINE HCL 5 MG PO TABS: 5.0000 mg | ORAL_TABLET | Freq: Three times a day (TID) | ORAL | 0 refills | Status: AC | PRN

## 2020-02-23 MED ORDER — CYCLOBENZAPRINE HCL 10 MG PO TABS
10.0000 mg | ORAL_TABLET | Freq: Once | ORAL | Status: AC
  Administered 2020-02-23: 19:00:00 10 mg via ORAL
  Filled 2020-02-23: qty 1

## 2020-02-23 MED ORDER — IBUPROFEN 800 MG PO TABS: 800.0000 mg | ORAL_TABLET | Freq: Three times a day (TID) | ORAL | 0 refills | Status: AC | PRN

## 2020-02-23 MED ORDER — IBUPROFEN 800 MG PO TABS
800.0000 mg | ORAL_TABLET | Freq: Once | ORAL | Status: AC
  Administered 2020-02-23: 800 mg via ORAL
  Filled 2020-02-23: qty 1

## 2020-02-23 NOTE — ED Notes (Signed)
See triage note   Presents with right shoulder pain  States he developed pain to shoulder after scraping sheet rock  No deformity noted

## 2020-02-23 NOTE — ED Triage Notes (Signed)
Patient presents to the ED with right shoulder pain after trying to scrape sheet rock off a wall.  Patient states he was working, but he is not on [employer] of that company and does not want to file workman's comp.  Patient states he broke his clavicle 10 years ago and pain is similar.  Patient states injury occurred 3 days ago.

## 2020-02-23 NOTE — Discharge Instructions (Signed)
Your exam and XR are consistent with a shoulder strain. There is no evidence of a fracture. Take the muscle relaxant at bedtime. Take the ibuprofen with food, 3 times a day.

## 2020-02-24 NOTE — ED Provider Notes (Signed)
[facility] Emergency Department Provider Note ____________________________________________  Time seen: 83  I have reviewed the triage vital signs and the nursing notes.  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  Patient admits to excessive work overhead as well as moving a Retail [employer].  He describes pain to the distal shoulder and reports a remote history of a broken clavicle 10 years ago.  He denies any direct trauma, or fall to the shoulder.  Denies any distal paresthesias, grip changes, or swelling.  He also denies any chest pain or shortness of breath.   Past Medical History:  Diagnosis Date  . ADD (attention deficit disorder)   . Pancreatitis   . Patient denies medical problems     Patient Active Problem List   Diagnosis Date Noted  . Intractable abdominal pain 07/24/2016  . Nausea vomiting and diarrhea 07/24/2016    Past Surgical History:  Procedure Laterality Date  . COLONOSCOPY WITH PROPOFOL N/A 07/28/2016   Procedure: COLONOSCOPY WITH PROPOFOL;  Surgeon: Scot Jun, MD;  Location: [facility] ENDOSCOPY;  Service: Endoscopy;  Laterality: N/A;  . NO PAST SURGERIES      Prior to Admission medications   Medication Sig Start Date End Date Taking? Authorizing Provider  cyclobenzaprine (FLEXERIL) 5 MG tablet Take 1 tablet (5 mg total) by mouth 3 (three) times daily as needed for up to 3 days. 02/23/20 02/26/20  Adoni Greenough, Charlesetta Ivory, PA-C  ibuprofen (ADVIL) 800 MG tablet Take 1 tablet (800 mg total) by mouth every 8 (eight) hours as needed. 02/23/20   Mckinnley Smithey, Charlesetta Ivory, PA-C    Allergies Patient has no known allergies.  Family History  Problem Relation Age of Onset  . Osteoporosis Maternal Grandmother     Social History Social History   Tobacco Use  .  Smoking status: Current Every Day Smoker    Types: Cigarettes  . Smokeless tobacco: Never Used  Substance Use Topics  . Alcohol use: No  . Drug use: Yes    Types: Marijuana    Review of Systems  Constitutional: Negative for fever. Cardiovascular: Negative for chest pain. Respiratory: Negative for shortness of breath. Musculoskeletal: Negative for back pain. Right shoulder pain as above.  Skin: Negative for rash. Neurological: Negative for headaches, focal weakness or numbness. ____________________________________________  PHYSICAL EXAM:  VITAL SIGNS: ED Triage Vitals  Enc Vitals Group     BP 02/23/20 1740 (!) 118/97     Pulse Rate 02/23/20 1740 85     Resp 02/23/20 1740 16     Temp 02/23/20 1740 98.2 F (36.8 C)     Temp Source 02/23/20 1740 Oral     SpO2 02/23/20 1740 100 %     Weight 02/23/20 1741 155 lb (70.3 kg)     Height 02/23/20 1741 5\' 6"  (1.676 m)     Head Circumference --      Peak Flow --      Pain Score 02/23/20 1741 9     Pain Loc --      Pain Edu? --      Excl. in GC? --     Constitutional: Alert and oriented. Well appearing and in no distress. Head: Normocephalic and atraumatic. Eyes: Conjunctivae are normal. Normal extraocular movements Neck: Supple.  Normal range of motion without crepitus.  Cardiovascular: Normal rate, regular rhythm. Normal distal pulses. Respiratory: Normal respiratory effort. No wheezes/rales/rhonchi. Musculoskeletal: Right shoulder without obvious deformity, dislocation, or sulcus sign.  Patient with normal active range of motion to the right shoulder.  He is tender to palpation to the distal AC region.  No step-off or separation is appreciated.  Normal composite fist distally.  Nontender with normal range of motion in all extremities.  Neurologic: Cranial nerves II through XII grossly intact.  Normal UV DTRs bilaterally.  Normal gait without ataxia. Normal speech and language. No gross focal neurologic deficits are  appreciated. Skin:  Skin is warm, dry and intact. No rash noted. ____________________________________________   RADIOLOGY  DG Right Shoulder  IMPRESSION: 1. No fracture or malalignment at the glenohumeral interval 2. Slight superior elevation of the distal right clavicle though without joint space widening. Correlate for tenderness over the [facility] joint. ____________________________________________  PROCEDURES  Ibuprofen 800 mg p.o. Cyclobenzaprine 10 mg p.o.  Procedures ____________________________________________  INITIAL IMPRESSION / ASSESSMENT AND PLAN / ED COURSE  Patient with ED evaluation of acute right shoulder pain after working on drywall for a day.  Patient's x-ray is negative for any acute fracture or dislocation.  Patient's x-ray is negative for any acute fracture or dislocation.  Patient is clinical picture likely represents an acute tendinitis to the shoulder secondary to excessive overhead work.  He is discharged at this time with cyclobenzaprine and ibuprofen to take as directed.  He is referred to orthopedics for ongoing symptomatically.  He should follow up as directed and manage work activities as appropriate.  RONNE STEFANSKI was evaluated in Emergency Department on 02/24/2020 for the symptoms described in the history of present illness. He was evaluated in the context of the global COVID-19 pandemic, which necessitated consideration that the patient might be at risk for infection with the SARS-CoV-2 virus that causes COVID-19. Institutional protocols and algorithms that pertain to the evaluation of patients at risk for COVID-19 are in a state of rapid change based on information released by regulatory bodies including the CDC and federal and state organizations. These policies and algorithms were followed during the patient's care in the ED. ____________________________________________  FINAL CLINICAL IMPRESSION(S) / ED DIAGNOSES  Final diagnoses:  Strain of right  shoulder, initial encounter      Melvenia Needles, PA-C 02/26/20 1106    Merlyn Lot, MD 02/27/20 1257

## 2021-11-14 DIAGNOSIS — R042 Hemoptysis: Secondary | ICD-10-CM | POA: Diagnosis not present
